# Patient Record
Sex: Female | Born: 1945 | Race: White | Hispanic: Yes | Marital: Married | Smoking: Never smoker
Health system: Southern US, Community
[De-identification: ages and names within clinical notes are randomized; demographics above are authoritative.]

## PROBLEM LIST (undated history)

## (undated) DIAGNOSIS — C50111 Malignant neoplasm of central portion of right female breast: Principal | ICD-10-CM

## (undated) DIAGNOSIS — H269 Unspecified cataract: Secondary | ICD-10-CM

## (undated) DIAGNOSIS — E119 Type 2 diabetes mellitus without complications: Secondary | ICD-10-CM

## (undated) DIAGNOSIS — C50919 Malignant neoplasm of unspecified site of unspecified female breast: Secondary | ICD-10-CM

## (undated) DIAGNOSIS — I10 Essential (primary) hypertension: Secondary | ICD-10-CM

## (undated) DIAGNOSIS — Z923 Personal history of irradiation: Secondary | ICD-10-CM

## (undated) DIAGNOSIS — H3552 Pigmentary retinal dystrophy: Secondary | ICD-10-CM

## (undated) DIAGNOSIS — R159 Full incontinence of feces: Secondary | ICD-10-CM

## (undated) DIAGNOSIS — E785 Hyperlipidemia, unspecified: Secondary | ICD-10-CM

## (undated) DIAGNOSIS — R32 Unspecified urinary incontinence: Secondary | ICD-10-CM

## (undated) HISTORY — DX: Malignant neoplasm of central portion of right female breast: C50.111

## (undated) HISTORY — DX: Malignant neoplasm of unspecified site of unspecified female breast: C50.919

---

## 2002-06-18 ENCOUNTER — Emergency Department (HOSPITAL_COMMUNITY): Admission: EM | Admit: 2002-06-18 | Discharge: 2002-06-18 | Payer: Self-pay | Admitting: Emergency Medicine

## 2002-06-18 ENCOUNTER — Encounter: Payer: Self-pay | Admitting: Emergency Medicine

## 2003-05-17 ENCOUNTER — Emergency Department (HOSPITAL_COMMUNITY): Admission: EM | Admit: 2003-05-17 | Discharge: 2003-05-18 | Payer: Self-pay | Admitting: *Deleted

## 2003-06-24 ENCOUNTER — Encounter: Admission: RE | Admit: 2003-06-24 | Discharge: 2003-06-24 | Payer: Self-pay | Admitting: Internal Medicine

## 2003-08-03 ENCOUNTER — Encounter: Admission: RE | Admit: 2003-08-03 | Discharge: 2003-08-03 | Payer: Self-pay | Admitting: Internal Medicine

## 2003-08-03 ENCOUNTER — Ambulatory Visit (HOSPITAL_COMMUNITY): Admission: RE | Admit: 2003-08-03 | Discharge: 2003-08-03 | Payer: Self-pay | Admitting: Internal Medicine

## 2003-09-01 ENCOUNTER — Encounter: Admission: RE | Admit: 2003-09-01 | Discharge: 2003-09-01 | Payer: Self-pay | Admitting: Internal Medicine

## 2004-03-16 ENCOUNTER — Ambulatory Visit: Payer: Self-pay | Admitting: Internal Medicine

## 2004-04-05 ENCOUNTER — Ambulatory Visit: Payer: Self-pay | Admitting: Internal Medicine

## 2004-04-22 ENCOUNTER — Encounter (INDEPENDENT_AMBULATORY_CARE_PROVIDER_SITE_OTHER): Payer: Self-pay | Admitting: Internal Medicine

## 2004-04-24 ENCOUNTER — Ambulatory Visit: Payer: Self-pay | Admitting: Internal Medicine

## 2004-04-24 ENCOUNTER — Encounter (INDEPENDENT_AMBULATORY_CARE_PROVIDER_SITE_OTHER): Payer: Self-pay | Admitting: Internal Medicine

## 2004-04-24 LAB — CONVERTED CEMR LAB: Pap Smear: NORMAL

## 2004-04-25 ENCOUNTER — Ambulatory Visit (HOSPITAL_COMMUNITY): Admission: RE | Admit: 2004-04-25 | Discharge: 2004-04-25 | Payer: Self-pay | Admitting: Family Medicine

## 2004-06-25 ENCOUNTER — Ambulatory Visit: Payer: Self-pay | Admitting: Internal Medicine

## 2004-09-26 ENCOUNTER — Ambulatory Visit: Payer: Self-pay | Admitting: Internal Medicine

## 2004-10-03 ENCOUNTER — Ambulatory Visit: Payer: Self-pay

## 2004-10-26 ENCOUNTER — Ambulatory Visit: Payer: Self-pay | Admitting: Internal Medicine

## 2004-11-28 ENCOUNTER — Ambulatory Visit: Payer: Self-pay | Admitting: Internal Medicine

## 2005-01-15 ENCOUNTER — Ambulatory Visit: Payer: Self-pay | Admitting: Internal Medicine

## 2005-01-25 ENCOUNTER — Ambulatory Visit (HOSPITAL_COMMUNITY): Admission: RE | Admit: 2005-01-25 | Discharge: 2005-01-25 | Payer: Self-pay | Admitting: Internal Medicine

## 2005-02-22 ENCOUNTER — Ambulatory Visit: Payer: Self-pay | Admitting: Internal Medicine

## 2005-04-19 ENCOUNTER — Ambulatory Visit (HOSPITAL_COMMUNITY): Admission: RE | Admit: 2005-04-19 | Discharge: 2005-04-19 | Payer: Self-pay | Admitting: Gastroenterology

## 2005-06-17 ENCOUNTER — Ambulatory Visit: Payer: Self-pay | Admitting: Internal Medicine

## 2005-10-30 ENCOUNTER — Encounter (INDEPENDENT_AMBULATORY_CARE_PROVIDER_SITE_OTHER): Payer: Self-pay | Admitting: Internal Medicine

## 2005-10-30 DIAGNOSIS — K7689 Other specified diseases of liver: Secondary | ICD-10-CM

## 2005-10-30 DIAGNOSIS — E118 Type 2 diabetes mellitus with unspecified complications: Secondary | ICD-10-CM | POA: Insufficient documentation

## 2005-10-30 DIAGNOSIS — R519 Headache, unspecified: Secondary | ICD-10-CM | POA: Insufficient documentation

## 2005-10-30 DIAGNOSIS — E119 Type 2 diabetes mellitus without complications: Secondary | ICD-10-CM | POA: Insufficient documentation

## 2005-10-30 DIAGNOSIS — R945 Abnormal results of liver function studies: Secondary | ICD-10-CM | POA: Insufficient documentation

## 2005-10-30 DIAGNOSIS — J309 Allergic rhinitis, unspecified: Secondary | ICD-10-CM | POA: Insufficient documentation

## 2005-10-30 DIAGNOSIS — R51 Headache: Secondary | ICD-10-CM

## 2005-10-30 DIAGNOSIS — E785 Hyperlipidemia, unspecified: Secondary | ICD-10-CM | POA: Insufficient documentation

## 2005-10-30 DIAGNOSIS — H3552 Pigmentary retinal dystrophy: Secondary | ICD-10-CM | POA: Insufficient documentation

## 2005-10-30 DIAGNOSIS — H543 Unqualified visual loss, both eyes: Secondary | ICD-10-CM | POA: Insufficient documentation

## 2005-10-30 DIAGNOSIS — G609 Hereditary and idiopathic neuropathy, unspecified: Secondary | ICD-10-CM | POA: Insufficient documentation

## 2005-10-30 DIAGNOSIS — D259 Leiomyoma of uterus, unspecified: Secondary | ICD-10-CM

## 2005-10-30 DIAGNOSIS — H269 Unspecified cataract: Secondary | ICD-10-CM | POA: Insufficient documentation

## 2005-10-30 DIAGNOSIS — M7500 Adhesive capsulitis of unspecified shoulder: Secondary | ICD-10-CM

## 2005-10-30 DIAGNOSIS — K219 Gastro-esophageal reflux disease without esophagitis: Secondary | ICD-10-CM | POA: Insufficient documentation

## 2005-10-30 DIAGNOSIS — I10 Essential (primary) hypertension: Secondary | ICD-10-CM | POA: Insufficient documentation

## 2005-10-30 DIAGNOSIS — L80 Vitiligo: Secondary | ICD-10-CM

## 2006-07-04 ENCOUNTER — Telehealth (INDEPENDENT_AMBULATORY_CARE_PROVIDER_SITE_OTHER): Payer: Self-pay | Admitting: *Deleted

## 2008-05-11 ENCOUNTER — Encounter: Admission: RE | Admit: 2008-05-11 | Discharge: 2008-05-11 | Payer: Self-pay | Admitting: Specialist

## 2010-01-27 ENCOUNTER — Encounter: Payer: Self-pay | Admitting: Internal Medicine

## 2012-02-07 ENCOUNTER — Encounter (INDEPENDENT_AMBULATORY_CARE_PROVIDER_SITE_OTHER): Payer: Self-pay | Admitting: Ophthalmology

## 2012-02-07 DIAGNOSIS — I10 Essential (primary) hypertension: Secondary | ICD-10-CM

## 2012-02-07 DIAGNOSIS — H35459 Secondary pigmentary degeneration, unspecified eye: Secondary | ICD-10-CM

## 2012-02-07 DIAGNOSIS — H35039 Hypertensive retinopathy, unspecified eye: Secondary | ICD-10-CM

## 2012-02-07 DIAGNOSIS — H4320 Crystalline deposits in vitreous body, unspecified eye: Secondary | ICD-10-CM

## 2012-02-07 DIAGNOSIS — E11319 Type 2 diabetes mellitus with unspecified diabetic retinopathy without macular edema: Secondary | ICD-10-CM

## 2012-02-07 DIAGNOSIS — E1165 Type 2 diabetes mellitus with hyperglycemia: Secondary | ICD-10-CM

## 2015-01-08 DIAGNOSIS — Z923 Personal history of irradiation: Secondary | ICD-10-CM

## 2015-01-08 HISTORY — DX: Personal history of irradiation: Z92.3

## 2015-01-11 ENCOUNTER — Telehealth (HOSPITAL_COMMUNITY): Payer: Self-pay | Admitting: *Deleted

## 2015-01-11 NOTE — Telephone Encounter (Signed)
Telephoned patient at home # and confirmed appointment with patient's husband. Used interpreter Lavon Paganini.

## 2015-01-12 ENCOUNTER — Encounter (HOSPITAL_COMMUNITY): Payer: Self-pay

## 2015-01-12 ENCOUNTER — Ambulatory Visit (HOSPITAL_COMMUNITY)
Admission: RE | Admit: 2015-01-12 | Discharge: 2015-01-12 | Disposition: A | Discharge status: Home / Self Care | Payer: Self-pay | Source: Ambulatory Visit | Attending: Obstetrics and Gynecology | Admitting: Obstetrics and Gynecology

## 2015-01-12 VITALS — BP 122/74 | Temp 97.8°F | Wt 135.0 lb

## 2015-01-12 DIAGNOSIS — N644 Mastodynia: Secondary | ICD-10-CM

## 2015-01-12 DIAGNOSIS — Z1239 Encounter for other screening for malignant neoplasm of breast: Secondary | ICD-10-CM

## 2015-01-12 HISTORY — DX: Hyperlipidemia, unspecified: E78.5

## 2015-01-12 HISTORY — DX: Essential (primary) hypertension: I10

## 2015-01-12 HISTORY — DX: Type 2 diabetes mellitus without complications: E11.9

## 2015-01-12 NOTE — Patient Instructions (Signed)
Educational materials on self breast awareness given. Explained to [facility] that she did not need a Pap smear today due to last Pap smear was in November 2015 per patient. Let her know to discuss with her PCP if she needs further Pap smears due to being over 70 years old. Referred patient to [facility] for right breast biopsy per recommendation. Appointment scheduled for Monday, January 16, 2015 at 0900. Patient aware of appointment and will be there. Rosa Padilla-Luna verbalized understanding.  Elija Mccamish, Arvil Chaco, RN 3:45 PM

## 2015-01-12 NOTE — Progress Notes (Signed)
Complaints of bilateral outer breast tenderness when touches.  Pap Smear: Pap smear not completed today. Last Pap smear was in November 2015 at the free cervical cancer screening at the [facility] that was sponsored by the [facility] and normal. Per patient has no history of an abnormal Pap smear. No Pap smear results in EPIC.  Physical exam: Breasts Breasts symmetrical. No skin abnormalities bilateral breasts. No nipple retraction bilateral breasts. No nipple discharge bilateral breasts. No lymphadenopathy. No lumps palpated bilateral breasts. Complaints of bilateral outer breast and right center breast tenderness on exam. Referred patient to [facility] for right breast biopsy per recommendation. Appointment scheduled for Monday, January 16, 2015 at 0900.     Pelvic/Bimanual No Pap smear completed today since last Pap smear was in November 2015 per patient. Pap smear not indicated per BCCCP guidelines.   Smoking History: Patient has never smoked.  Patient Navigation: Patient education provided. Access to services provided for patient through [facility] program. Spanish interpreter provided.  Colorectal Cancer Screening: Colonoscopy completed 6-7 years ago.  Used Spanish Interpreter [employer]

## 2015-01-16 ENCOUNTER — Other Ambulatory Visit: Payer: Self-pay | Admitting: Radiology

## 2015-01-20 ENCOUNTER — Telehealth: Payer: Self-pay | Admitting: *Deleted

## 2015-01-20 ENCOUNTER — Encounter (HOSPITAL_COMMUNITY): Payer: Self-pay | Admitting: *Deleted

## 2015-01-20 ENCOUNTER — Encounter: Payer: Self-pay | Admitting: *Deleted

## 2015-01-20 DIAGNOSIS — C50111 Malignant neoplasm of central portion of right female breast: Secondary | ICD-10-CM

## 2015-01-20 DIAGNOSIS — D0511 Intraductal carcinoma in situ of right breast: Secondary | ICD-10-CM | POA: Insufficient documentation

## 2015-01-20 HISTORY — DX: Malignant neoplasm of central portion of right female breast: C50.111

## 2015-01-20 NOTE — Telephone Encounter (Signed)
Confirmed [facility] for 01/25/15 at 0830 .  Instructions and contact information given.

## 2015-01-25 ENCOUNTER — Encounter: Payer: Self-pay | Admitting: Hematology

## 2015-01-25 ENCOUNTER — Other Ambulatory Visit: Payer: Self-pay | Admitting: General Surgery

## 2015-01-25 ENCOUNTER — Encounter: Payer: Self-pay | Admitting: Skilled Nursing Facility1

## 2015-01-25 ENCOUNTER — Ambulatory Visit: Payer: Self-pay | Admitting: Physical Therapy

## 2015-01-25 ENCOUNTER — Encounter: Payer: Self-pay | Admitting: Nurse Practitioner

## 2015-01-25 ENCOUNTER — Other Ambulatory Visit (HOSPITAL_BASED_OUTPATIENT_CLINIC_OR_DEPARTMENT_OTHER): Payer: Self-pay

## 2015-01-25 ENCOUNTER — Encounter: Payer: Self-pay | Admitting: *Deleted

## 2015-01-25 ENCOUNTER — Ambulatory Visit (HOSPITAL_BASED_OUTPATIENT_CLINIC_OR_DEPARTMENT_OTHER): Payer: Self-pay | Admitting: Hematology

## 2015-01-25 ENCOUNTER — Ambulatory Visit
Admission: RE | Admit: 2015-01-25 | Discharge: 2015-01-25 | Disposition: A | Discharge status: Home / Self Care | Payer: Self-pay | Source: Ambulatory Visit | Attending: Radiation Oncology | Admitting: Radiation Oncology

## 2015-01-25 VITALS — BP 145/71 | HR 79 | Temp 97.5°F | Resp 18 | Ht 62.0 in | Wt 134.4 lb

## 2015-01-25 DIAGNOSIS — D0511 Intraductal carcinoma in situ of right breast: Secondary | ICD-10-CM

## 2015-01-25 DIAGNOSIS — C50111 Malignant neoplasm of central portion of right female breast: Secondary | ICD-10-CM

## 2015-01-25 DIAGNOSIS — E119 Type 2 diabetes mellitus without complications: Secondary | ICD-10-CM

## 2015-01-25 DIAGNOSIS — I10 Essential (primary) hypertension: Secondary | ICD-10-CM

## 2015-01-25 DIAGNOSIS — Z170 Estrogen receptor positive status [ER+]: Secondary | ICD-10-CM

## 2015-01-25 DIAGNOSIS — Z803 Family history of malignant neoplasm of breast: Secondary | ICD-10-CM

## 2015-01-25 DIAGNOSIS — Z800 Family history of malignant neoplasm of digestive organs: Secondary | ICD-10-CM

## 2015-01-25 LAB — CBC WITH DIFFERENTIAL/PLATELET
BASO%: 0.4 % (ref 0.0–2.0)
BASOS ABS: 0 10*3/uL (ref 0.0–0.1)
EOS ABS: 0.1 10*3/uL (ref 0.0–0.5)
EOS%: 1.4 % (ref 0.0–7.0)
HEMATOCRIT: 36.6 % (ref 34.8–46.6)
HEMOGLOBIN: 12.4 g/dL (ref 11.6–15.9)
LYMPH#: 1.4 10*3/uL (ref 0.9–3.3)
LYMPH%: 27.5 % (ref 14.0–49.7)
MCH: 30.6 pg (ref 25.1–34.0)
MCHC: 34 g/dL (ref 31.5–36.0)
MCV: 90 fL (ref 79.5–101.0)
MONO#: 0.5 10*3/uL (ref 0.1–0.9)
MONO%: 9.5 % (ref 0.0–14.0)
NEUT#: 3.2 10*3/uL (ref 1.5–6.5)
NEUT%: 61.2 % (ref 38.4–76.8)
Platelets: 182 10*3/uL (ref 145–400)
RBC: 4.06 10*6/uL (ref 3.70–5.45)
RDW: 13 % (ref 11.2–14.5)
WBC: 5.2 10*3/uL (ref 3.9–10.3)

## 2015-01-25 LAB — COMPREHENSIVE METABOLIC PANEL
ALBUMIN: 4.1 g/dL (ref 3.5–5.0)
ALT: 35 U/L (ref 0–55)
AST: 29 U/L (ref 5–34)
Alkaline Phosphatase: 54 U/L (ref 40–150)
Anion Gap: 10 mEq/L (ref 3–11)
BUN: 8.4 mg/dL (ref 7.0–26.0)
CO2: 24 meq/L (ref 22–29)
Calcium: 9.3 mg/dL (ref 8.4–10.4)
Chloride: 102 mEq/L (ref 98–109)
Creatinine: 0.7 mg/dL (ref 0.6–1.1)
GLUCOSE: 140 mg/dL (ref 70–140)
POTASSIUM: 3.8 meq/L (ref 3.5–5.1)
Sodium: 136 mEq/L (ref 136–145)
TOTAL PROTEIN: 7.1 g/dL (ref 6.4–8.3)
Total Bilirubin: 0.51 mg/dL (ref 0.20–1.20)

## 2015-01-25 NOTE — Progress Notes (Signed)
Clinical Social Work [facility] Psychosocial Distress Screening [facility]  Patient completed distress screening protocol and scored a 6 on the Psychosocial Distress Thermometer which indicates moderate distress. Clinical Social Worker met with interpreter, patient and patients family in [facility] to assess for distress and other psychosocial needs. Patient stated she was feeling overwhelmed but felt "better" after meeting with the treatment team and getting more information on her treatment plan. CSW and patient discussed common feeling and emotions when being diagnosed with cancer, and the importance of support during treatment. CSW informed patient of the support team and support services at [facility]. CSW provided contact information and encouraged patient to call with any questions or concerns.   [facility] DISTRESS SCREENING 01/25/2015  Screening Type Initial Screening  Distress experienced in past week (1-10) 6  Practical problem type Insurance  Physician notified of physical symptoms Yes  Referral to clinical psychology No  Referral to clinical social work Yes  Referral to dietition No  Referral to financial advocate No  Referral to support programs No  Referral to palliative care No   Abigail Elmore, MSW, LCSW, OSW-C Clinical Social Worker [facility] [phone]       

## 2015-01-25 NOTE — Progress Notes (Signed)
  Radiation Oncology         [phone]) [phone] ________________________________  Initial Outpatient Consultation - Date: 01/25/2015   Name: Gail Manning MRN: [id number]   DOB: 11-Oct-1945  REFERRING PHYSICIAN: Alyssa Grove, MD  DIAGNOSIS AND STAGE: Stage 0 DCIS  HISTORY OF PRESENT ILLNESS::Gail Manning is a 70 y.o. female who presented to the [place] program earlier this year. Mammograms were ordered and the right mammogram showed an area of calcifications measuring about 1.5 cm. These were in the lower outer quadrant of the right breast. Stereotactic biopsy showed DCIS with calcifications and necrosis. This was intermediate grade and was ER/PR positive. She had no physical symptoms. An interpreter was present during this encounter. Her husband is present with her as well. She has bruising since her biopsy.   PREVIOUS RADIATION THERAPY: No  Past medical, social and family history were reviewed in the electronic chart. Review of symptoms was reviewed in the electronic chart. Medications were reviewed in the electronic chart.   Gynecologic History  Age at first menstrual period? 14  Are you still having periods? No Approximate date of last period? Age 83  If you no longer have periods: Have you used hormone replacement? Yes  If YES, for how long? 4-5 months Obstetric History:  How many children have you carried to term? 3 Your age at first live birth? 89  Pregnant now or trying to get pregnant? No  Have you used birth control pills or hormone shots for contraception? Yes  If so, for how long (or approximate dates)? 3 months or years Health Maintenance:  Have you ever had a colonoscopy? Yes  Have you ever had a bone density? Yes  Date of your last PAP smear? 3 years  PHYSICAL EXAM:  Vitals with BMI 01/25/2015  Height 5\' 2"   Weight 134 lbs 6 oz  BMI 123456  Systolic [id number]  Diastolic 71  Pulse 79  Respirations 18  Alert. Appears younger than her stated age. She has bruising over  the entire right breast with a palpable hematoma. No palpable abnormalities over the left breast. No palpable cervical, supraclavicular, or axillary adenopathy.  IMPRESSION: Stage 0 DCIS  PLAN: I spoke to the patient today regarding her diagnosis and options for treatment. We discussed the equivalence in terms of survival and local failure between mastectomy and breast conservation. We discussed the role of radiation in decreasing local failures in patients who undergo lumpectomy. We discussed the process of CT simulation and the placement tattoos. We discussed 4 weeks of treatment as an outpatient. We discussed the possibility of asymptomatic lung damage. We discussed the low likelihood of secondary malignancies. We discussed the possible side effects including but not limited to skin redness, fatigue, permanent skin darkening, and breast swelling.  I printed a letter for her husband to excuse him from work for the next two months since he provides transportation for her.  I spent 40 minutes  face to face with the patient and more than 50% of that time was spent in counseling and/or coordination of care.   ------------------------------------------------  Thea Silversmith, MD  This document serves as a record of services personally performed by Thea Silversmith, MD. It was created on her behalf by Darcus Austin, a trained medical scribe. The creation of this record is based on the scribe's personal observations and the provider's statements to them. This document has been checked and approved by the attending provider.

## 2015-01-25 NOTE — Progress Notes (Signed)
Subjective:     Patient ID: Gail Manning, female   DOB: 1945/06/17, 70 y.o.   MRN: [id number]  HPI   Review of Systems     Objective:   Physical Exam For the patient to understand and be given the tools to implement a healthy plant based diet during their cancer diagnosis.     Assessment:     Patient was seen today and found to be in good spirits and accompanied by her husband. Pt was extremely knowledgeable of the nutrition information. Dietitian quized the pt and she was able to correctly answer them all. Pt was just educated at the [facility] one month ago.      Plan:     Dietitian will send what spanish materials are availble.

## 2015-01-25 NOTE — Progress Notes (Signed)
[place]  [phone] Fax:([phone]) [place] Note   Patient Care Team: Alyssa Grove, MD as PCP - General Sylvan Cheese, NP as Nurse Practitioner (Hematology and Oncology) 01/25/2015  CHIEF COMPLAINTS/PURPOSE OF CONSULTATION:  Right breast DCIS  Oncology History   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, right   Staging form: Breast, AJCC 7th Edition     Clinical stage from 01/16/2015: Stage 0 (Tis (DCIS), N0, M0) - Signed by Truitt Merle, MD on 01/25/2015       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 Spanish-speaking female is here because of her recently diagnosed right breast cancer. She is accompanied her husband and interpreter Gregary Signs to our multidisciplinary breast clinic today.  This was discovered by screening mammogram. She had no palpable breast mass, no skin or nipple change. She denies any pain, or other new symptoms. She has good appetite and energy level, no recent weight loss. She recently had some left hearing issue, will see a ENT. 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 She lives with her husband, does not exercise regularly, but is physically active.    GYN HISTORY  Menarchal: 17 LMP: 53 Contraceptive: No       HRT: 4-5 months  G6P3: (+) breast feeding    MEDICAL HISTORY:  Past Medical History  Diagnosis Date  . Hypertension   . Hyperlipidemia   . Diabetes mellitus without complication ([place])   . 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 Breast cancer [facility])     SURGICAL HISTORY: Past Surgical  History  Procedure Laterality Date  . Cesarean section      x3    SOCIAL HISTORY: Social History   Social History  . Marital Status: Married    Spouse Name: N/A  . Number of Children: N/A  . Years of Education: N/A   Occupational History  . Not on file.   Social History Main Topics  . Smoking status: Never Smoker   . Smokeless tobacco: Never Used  . Alcohol Use: No  . Drug Use: No  . Sexual Activity: Not on file   Other Topics Concern  . Not on file   Social History Narrative    FAMILY HISTORY: Family History  Problem Relation Age of Onset  . Diabetes Mother   . Hypertension Mother   . Rheum arthritis Mother   . Cancer Father     colon  . Diabetes Father   . Hypertension Father   . Breast cancer Sister   . Hypertension Sister     ALLERGIES:  is allergic to albuterol sulfate.  MEDICATIONS:  Current Outpatient Prescriptions  Medication Sig Dispense Refill  . calcium & magnesium carbonates (MYLANTA) 810-175 MG tablet Take 3 tablets by mouth daily.    Marland Kitchen lisinopril (PRINIVIL,ZESTRIL) 20 MG tablet Take 20 mg by mouth daily.    Marland Kitchen lovastatin (MEVACOR) 20 MG tablet Take 20 mg by mouth at bedtime.    . metFORMIN (GLUCOPHAGE) 1000 MG tablet Take 1,000 mg by mouth 2 (two) times daily with a meal. Reported on  01/25/2015    . naproxen sodium (ALEVE) 220 MG tablet Take 440 mg by mouth 2 (two) times daily with a meal.     No current facility-administered medications for this visit.    REVIEW OF SYSTEMS:   Constitutional: Denies fevers, chills or abnormal night sweats Eyes: Denies blurriness of vision, double vision or watery eyes Ears, nose, mouth, throat, and face: Denies mucositis or sore throat Respiratory: Denies cough, dyspnea or wheezes Cardiovascular: Denies palpitation, chest discomfort or lower extremity swelling Gastrointestinal:  Denies nausea, heartburn or change in bowel habits Skin: Denies abnormal skin rashes Lymphatics: Denies new lymphadenopathy or  easy bruising Neurological:Denies numbness, tingling or new weaknesses Behavioral/Psych: Mood is stable, no new changes  All other systems were reviewed with the patient and are negative.  PHYSICAL EXAMINATION: ECOG PERFORMANCE STATUS: 1 - Symptomatic but completely ambulatory  Filed Vitals:   01/25/15 0903  BP: 145/71  Pulse: 79  Temp: 97.5 F (36.4 C)  Resp: 18   Filed Weights   01/25/15 0903  Weight: 134 lb 6.4 oz (60.963 kg)    GENERAL:alert, no distress and comfortable SKIN: skin color, texture, turgor are normal, no rashes or significant lesions EYES: normal, conjunctiva are pink and non-injected, sclera clear OROPHARYNX:no exudate, no erythema and lips, buccal mucosa, and tongue normal  NECK: supple, thyroid normal size, non-tender, without nodularity LYMPH:  no palpable lymphadenopathy in the cervical, axillary or inguinal LUNGS: clear to auscultation and percussion with normal breathing effort HEART: regular rate & rhythm and no murmurs and no lower extremity edema ABDOMEN:abdomen soft, non-tender and normal bowel sounds Musculoskeletal:no cyanosis of digits and no clubbing  PSYCH: alert & oriented x 3 with fluent speech NEURO: no focal motor/sensory deficits Breasts: Breast inspection showed them to be symmetrical with no nipple discharge. (+) significant skin bruise and a large hematoma in the biopsy site of the right breast, tender. Palpation of the left breasts and axilla revealed no obvious mass that I could appreciate.   LABORATORY DATA:  I have reviewed the data as listed Lab Results  Component Value Date   WBC 5.2 01/25/2015   HGB 12.4 01/25/2015   HCT 36.6 01/25/2015   MCV 90.0 01/25/2015   PLT 182 01/25/2015    Recent Labs  01/25/15 0845  NA 136  K 3.8  CO2 24  GLUCOSE 140  BUN 8.4  CREATININE 0.7  CALCIUM 9.3  PROT 7.1  ALBUMIN 4.1  AST 29  ALT 35  ALKPHOS 54  BILITOT 0.51    PATHOLOGY REPORT  Diagnosis 01/16/2015 Breast, right,  needle core biopsy - DUCTAL CARCINOMA IN SITU WITH NECROSIS AND CALCIFICATIONS. - SEE MICROSCOPIC DESCRIPTION Results: IMMUNOHISTOCHEMICAL AND MORPHOMETRIC ANALYSIS PERFORMED MANUALLY Estrogen Receptor: 100%, POSITIVE, STRONG STAINING INTENSITY Progesterone Receptor: 30%, POSITIVE, STRONG STAINING INTENSITY   RADIOGRAPHIC STUDIES: I have personally reviewed the radiological images as listed and agreed with the findings in the report. No results found.  ASSESSMENT & PLAN:  70 year-old postmenopausal woman, presented with screening discovered DCIS.  1. Right breast DCIS, intermediate grade, ER/PR strongly positive -I discussed her breast imaging and needle biopsy results with patient and her husband in great detail. -She is a candidate for breast conservation surgery. She has been seen by breast surgeon Dr. Excell Seltzer, who recommends lumpectomy. -we discussed that her DCIS will be cured by complete surgical resection, but as she is at increased risk for future breast cancer.Any form of adjuvant therapy is to prevent future breast cancer. -Her DCIS will be  cured by complete surgical resection. Any form of adjuvant therapy is preventive. -Given her strongly positive ER and PR, I recommend antiestrogen therapy with tamoxifen or anastrozole, which decrease her risk of future breast cancer by ~40%. Benefit and potential side effects of these drugs were discussed with her and her family members. She is interested. -She will likely benefit from breast radiation if she undergo lumpectomy to decrease the risk of breast cancer. -We also discussed that biopsy may have sampling limitation, we will review her surgical path, to see if she has any invasive carcinoma components. -We discussed breast cancer surveillance after she completes treatment, Including annual mammogram, breast exam every 6-12 months.  2. HTN, DM -she will continue follow-up with her primary care physician  Plan -She will likely have  lumpectomy soon -I'll see her 2-3 weeks after her surgery to review her pathology result and finalize her antiestrogen therapy.      All questions were answered. The patient knows to call the clinic with any problems, questions or concerns. I spent 45 minutes counseling the patient face to face. The total time spent in the appointment was 55 minutes and more than 50% was on counseling.     Truitt Merle, MD 01/25/2015 6:16 PM

## 2015-01-25 NOTE — Progress Notes (Signed)
Ms. Gail Manning is a very pleasant 70 y.o. female from [place], [place] with newly diagnosed ductal carcinoma in situ of the right breast.  Biopsy results revealed the tumor's prognostic profile is ER positive and PR positive. '  She presents today with her husband to the [facility]) for treatment consideration and recommendations from the breast surgeon, radiation oncologist, and medical oncologist.     I briefly met with Ms. Gail Manning and her husband during her [facility] visit today. With the assistance of Benjamine Sprague (interpreter), we discussed the purpose of the Survivorship Clinic, which will include monitoring for recurrence, coordinating completion of age and gender-appropriate cancer screenings, promotion of overall wellness, as well as managing potential late/long-term side effects of anti-cancer treatments.    The treatment plan for [name] will likely include surgery, radiation therapy, and anti-estrogen therapy.  As of today, the intent of treatment for [name] is cure, therefore she will be eligible for the Survivorship Clinic upon her completion of treatment.  Her survivorship care plan (SCP) document will be drafted and updated throughout the course of her treatment trajectory. She will receive the SCP in an office visit with myself in the Survivorship Clinic once she has completed treatment.   Ms. Gail Manning was encouraged to ask questions and all questions were answered to her satisfaction.  She was given my business card and encouraged to contact me with any concerns regarding survivorship.  I look forward to participating in her care.   Kenn File, [place] [phone]

## 2015-02-08 DIAGNOSIS — C50919 Malignant neoplasm of unspecified site of unspecified female breast: Secondary | ICD-10-CM

## 2015-02-08 HISTORY — DX: Malignant neoplasm of unspecified site of unspecified female breast: C50.919

## 2015-02-08 HISTORY — PX: BREAST LUMPECTOMY: SHX2

## 2015-02-09 NOTE — H&P (Signed)
History of Present Illness Gail Kitchen T. Season Astacio MD; 01/25/2015 10:01 AM) The patient is a 70 year old female who presents with breast cancer. patient is a very pleasant 70 year old Spanish-speaking female referred by Dr. Gabriel Rainwater a for a new diagnosis of ductal carcinoma in situ of the right breast. The patient has had regular screening mammograms. She had a sister at age 48 treated for breast cancer. Recent screening mammogram revealed a new area of calcifications in the right breast. Diagnostic mammogram was performed revealing a new Area of pleomorphic calcifications in the right breast central to the nipple anterior depth in the lower outer quadrant. this measured at the greatest about 2 cm. Stereotactic biopsy was performed revealing intermediate grade DCIS, ER/PR positive. She has had no breast symptoms, specifically lump, pain, nipple discharge or skin changes.   Other Problems Gail Slipper, RN; 01/24/2015 4:28 PM) Anxiety Disorder Back Pain Bladder Problems Depression Diabetes Mellitus Gastroesophageal Reflux Disease General anesthesia - complications Hemorrhoids High blood pressure Lump In Breast Transfusion history  Past Surgical History Gail Slipper, RN; 01/24/2015 4:28 PM) Breast Biopsy Right. Cesarean Section - Multiple  Diagnostic Studies History Gail Slipper, RN; 01/24/2015 4:28 PM) Colonoscopy 1-5 years ago Mammogram within last year Pap Smear >5 years ago  Medication History Gail Slipper, RN; 01/24/2015 4:28 PM) No Current Medications Medications Reconciled  Social History Gail Slipper, RN; 01/24/2015 4:28 PM) Alcohol use Occasional alcohol use. Caffeine use Coffee, Tea. No drug use Tobacco use Never smoker.  Family History Gail Slipper, RN; 01/24/2015 4:28 PM) Alcohol Abuse Father, Sister. Arthritis Mother. Breast Cancer Sister. Colon Cancer Father. Diabetes Mellitus Brother, Father, Mother. Heart Disease  Daughter. Hypertension Brother, Mother. Kidney Disease Daughter. Seizure disorder Brother.  Pregnancy / Birth History Gail Slipper, RN; 01/24/2015 4:28 PM) Age at menarche 67 years. Age of menopause 36-50 Gravida 7 Irregular periods Maternal age 39-25 Para 3    Review of Systems Gail Slipper RN; 01/24/2015 4:28 PM) General Present- Chills. Not Present- Appetite Loss, Fatigue, Fever, Night Sweats, Weight Gain and Weight Loss. Skin Present- Dryness and Rash. Not Present- Change in Wart/Mole, Hives, Jaundice, New Lesions, Non-Healing Wounds and Ulcer. HEENT Present- Earache, Hearing Loss, Ringing in the Ears, Sinus Pain, Sore Throat, Visual Disturbances and Wears glasses/contact lenses. Not Present- Hoarseness, Nose Bleed, Oral Ulcers, Seasonal Allergies and Yellow Eyes. Respiratory Present- Chronic Cough and Snoring. Not Present- Bloody sputum, Difficulty Breathing and Wheezing. Breast Present- Breast Mass and Breast Pain. Not Present- Nipple Discharge and Skin Changes. Cardiovascular Present- Swelling of Extremities. Not Present- Chest Pain, Difficulty Breathing Lying Down, Leg Cramps, Palpitations, Rapid Heart Rate and Shortness of Breath. Gastrointestinal Present- Change in Bowel Habits, Constipation, Hemorrhoids and Rectal Pain. Not Present- Abdominal Pain, Bloating, Bloody Stool, Chronic diarrhea, Difficulty Swallowing, Excessive gas, Gets full quickly at meals, Indigestion, Nausea and Vomiting. Female Genitourinary Present- Frequency, Painful Urination and Urgency. Not Present- Nocturia and Pelvic Pain. Musculoskeletal Present- Back Pain and Joint Pain. Not Present- Joint Stiffness, Muscle Pain, Muscle Weakness and Swelling of Extremities. Neurological Present- Fainting, Headaches, Numbness, Tremor and Trouble walking. Not Present- Decreased Memory, Seizures, Tingling and Weakness. Psychiatric Present- Change in Sleep Pattern and Depression. Not Present- Anxiety, Bipolar, Fearful  and Frequent crying. Endocrine Present- Cold Intolerance, Heat Intolerance and Hot flashes. Not Present- Excessive Hunger, Hair Changes and New Diabetes. Hematology Present- Excessive bleeding and Persistent Infections. Not Present- Easy Bruising, Gland problems and HIV.   Physical Exam Gail Kitchen T. Abrahan Fulmore MD; 01/25/2015 10:04 AM) The physical exam findings are as  follows: Note:General: Alert, elderly Hispanic female, in no distress Skin: Warm and dry without rash or infection. HEENT: No palpable masses or thyromegaly. Sclera nonicteric. Pupils equal round and reactive. Breasts: There is a moderate hematoma in the latere abnormalitiet which is tender. No other palpable abnormalities. No ng.ple inversion or skin changes or nipple crusting. Lymph nodes: No cervical, supraclavicular, nodes palpable. Lungs: Breath sounds clear and equal. No wheezing or increased work of breathing. Cardiovascular: Regular rate and rhythm without murmer. No JVD or edema. Abdomen: Nondistended. Soft and nontender. No masses palpable. No organomegaly. No palpable hernias. Extremities: No edema or joint swelling or deformity. No chronic venous stasis changes. Neurologic: Alert and fully oriented. gait is slow, walks with a cane Psychiatric: Normal mood and affect. Thought content appropriate with normal judgement and insight    Assessment & Plan Gail Kitchen T. Jamese Trauger MD; 01/25/2015 10:12 AM) DCIS (DUCTAL CARCINOMA IN SITU), RIGHT (D05.11) Impression: new diagnosis of ER/PR positive intermediate grade ductal carcinoma in situ of the right breast estimated size approximately 2 cm. I discussed with the patient and her husband via interpreter surgical options. I believe she would be a good candidate for lumpectomy and this is what she would prefer. I discussed use of radioactive seed localization and the nature of surgery. We discussed that she would likely need radiation therapy. We discussed possible complications  including bleeding, infection, anesthetic risks and possible need for further surgery based on final margins. All he questions were answered Current Plans Pt Education - [facility] Breast Cancer Information Given - [employer] "Breast Journey" Package Radioactive seed localized right breast lumpectomy

## 2015-02-10 ENCOUNTER — Ambulatory Visit (HOSPITAL_BASED_OUTPATIENT_CLINIC_OR_DEPARTMENT_OTHER)
Admission: RE | Admit: 2015-02-10 | Discharge: 2015-02-10 | Disposition: A | Discharge status: Home / Self Care | Payer: Self-pay | Source: Ambulatory Visit | Attending: General Surgery | Admitting: General Surgery

## 2015-02-10 ENCOUNTER — Encounter (HOSPITAL_BASED_OUTPATIENT_CLINIC_OR_DEPARTMENT_OTHER): Payer: Self-pay | Admitting: *Deleted

## 2015-02-10 ENCOUNTER — Ambulatory Visit (HOSPITAL_BASED_OUTPATIENT_CLINIC_OR_DEPARTMENT_OTHER): Payer: Self-pay | Admitting: Anesthesiology

## 2015-02-10 ENCOUNTER — Encounter (HOSPITAL_BASED_OUTPATIENT_CLINIC_OR_DEPARTMENT_OTHER)
Admission: RE | Disposition: A | Discharge status: Home / Self Care | Payer: Self-pay | Source: Ambulatory Visit | Attending: General Surgery

## 2015-02-10 ENCOUNTER — Telehealth: Payer: Self-pay | Admitting: *Deleted

## 2015-02-10 DIAGNOSIS — Z79899 Other long term (current) drug therapy: Secondary | ICD-10-CM | POA: Insufficient documentation

## 2015-02-10 DIAGNOSIS — K219 Gastro-esophageal reflux disease without esophagitis: Secondary | ICD-10-CM | POA: Insufficient documentation

## 2015-02-10 DIAGNOSIS — Z7984 Long term (current) use of oral hypoglycemic drugs: Secondary | ICD-10-CM | POA: Insufficient documentation

## 2015-02-10 DIAGNOSIS — M1990 Unspecified osteoarthritis, unspecified site: Secondary | ICD-10-CM | POA: Insufficient documentation

## 2015-02-10 DIAGNOSIS — I10 Essential (primary) hypertension: Secondary | ICD-10-CM | POA: Insufficient documentation

## 2015-02-10 DIAGNOSIS — N6091 Unspecified benign mammary dysplasia of right breast: Secondary | ICD-10-CM | POA: Insufficient documentation

## 2015-02-10 DIAGNOSIS — N6021 Fibroadenosis of right breast: Secondary | ICD-10-CM | POA: Insufficient documentation

## 2015-02-10 DIAGNOSIS — Z791 Long term (current) use of non-steroidal anti-inflammatories (NSAID): Secondary | ICD-10-CM | POA: Insufficient documentation

## 2015-02-10 DIAGNOSIS — D0511 Intraductal carcinoma in situ of right breast: Secondary | ICD-10-CM | POA: Insufficient documentation

## 2015-02-10 DIAGNOSIS — Z170 Estrogen receptor positive status [ER+]: Secondary | ICD-10-CM | POA: Insufficient documentation

## 2015-02-10 DIAGNOSIS — E119 Type 2 diabetes mellitus without complications: Secondary | ICD-10-CM | POA: Insufficient documentation

## 2015-02-10 HISTORY — PX: BREAST LUMPECTOMY WITH NEEDLE LOCALIZATION: SHX5759

## 2015-02-10 LAB — GLUCOSE, CAPILLARY
GLUCOSE-CAPILLARY: 104 mg/dL — AB (ref 65–99)
Glucose-Capillary: 95 mg/dL (ref 65–99)

## 2015-02-10 SURGERY — BREAST LUMPECTOMY WITH NEEDLE LOCALIZATION
Anesthesia: General | Site: Breast | Laterality: Right

## 2015-02-10 MED ORDER — FENTANYL CITRATE (PF) 100 MCG/2ML IJ SOLN
INTRAMUSCULAR | Status: AC
  Filled 2015-02-10: qty 2

## 2015-02-10 MED ORDER — MIDAZOLAM HCL 2 MG/2ML IJ SOLN
INTRAMUSCULAR | Status: AC
  Filled 2015-02-10: qty 2

## 2015-02-10 MED ORDER — HYDROMORPHONE HCL 1 MG/ML IJ SOLN
0.2500 mg | INTRAMUSCULAR | Status: DC | PRN
  Administered 2015-02-10: 0.5 mg via INTRAVENOUS
  Administered 2015-02-10: 0.25 mg via INTRAVENOUS
  Administered 2015-02-10: 0.5 mg via INTRAVENOUS

## 2015-02-10 MED ORDER — OXYCODONE HCL 5 MG PO TABS: 5.0000 mg | ORAL_TABLET | Freq: Once | ORAL | Status: DC | PRN

## 2015-02-10 MED ORDER — MIDAZOLAM HCL 5 MG/5ML IJ SOLN
INTRAMUSCULAR | Status: DC | PRN
  Administered 2015-02-10: 2 mg via INTRAVENOUS

## 2015-02-10 MED ORDER — PROPOFOL 10 MG/ML IV BOLUS
INTRAVENOUS | Status: DC | PRN
  Administered 2015-02-10: 150 mg via INTRAVENOUS

## 2015-02-10 MED ORDER — CHLORHEXIDINE GLUCONATE 4 % EX LIQD: 1.0000 "application " | Freq: Once | CUTANEOUS | Status: DC

## 2015-02-10 MED ORDER — BUPIVACAINE-EPINEPHRINE 0.5% -1:200000 IJ SOLN
INTRAMUSCULAR | Status: DC | PRN
  Administered 2015-02-10: 20 mL

## 2015-02-10 MED ORDER — ONDANSETRON HCL 4 MG/2ML IJ SOLN: 4.0000 mg | Freq: Four times a day (QID) | INTRAMUSCULAR | Status: DC | PRN

## 2015-02-10 MED ORDER — OXYCODONE HCL 5 MG/5ML PO SOLN: 5.0000 mg | Freq: Once | ORAL | Status: DC | PRN

## 2015-02-10 MED ORDER — LACTATED RINGERS IV SOLN
INTRAVENOUS | Status: DC
  Administered 2015-02-10: 10 mL/h via INTRAVENOUS

## 2015-02-10 MED ORDER — CEFAZOLIN SODIUM-DEXTROSE 2-3 GM-% IV SOLR
INTRAVENOUS | Status: AC
  Filled 2015-02-10: qty 50

## 2015-02-10 MED ORDER — ONDANSETRON HCL 4 MG/2ML IJ SOLN
INTRAMUSCULAR | Status: DC | PRN
  Administered 2015-02-10: 4 mg via INTRAVENOUS

## 2015-02-10 MED ORDER — CEFAZOLIN SODIUM-DEXTROSE 2-3 GM-% IV SOLR
2.0000 g | INTRAVENOUS | Status: AC
  Administered 2015-02-10: 2 g via INTRAVENOUS

## 2015-02-10 MED ORDER — HYDROCODONE-ACETAMINOPHEN 5-325 MG PO TABS: 1.0000 | ORAL_TABLET | ORAL | Status: DC | PRN

## 2015-02-10 MED ORDER — HYDROMORPHONE HCL 1 MG/ML IJ SOLN
INTRAMUSCULAR | Status: AC
  Filled 2015-02-10: qty 1

## 2015-02-10 MED ORDER — PHENYLEPHRINE HCL 10 MG/ML IJ SOLN
INTRAMUSCULAR | Status: DC | PRN
  Administered 2015-02-10 (×4): 40 ug via INTRAVENOUS

## 2015-02-10 MED ORDER — FENTANYL CITRATE (PF) 100 MCG/2ML IJ SOLN
INTRAMUSCULAR | Status: DC | PRN
  Administered 2015-02-10: 100 ug via INTRAVENOUS

## 2015-02-10 MED ORDER — LIDOCAINE HCL (CARDIAC) 20 MG/ML IV SOLN
INTRAVENOUS | Status: DC | PRN
  Administered 2015-02-10: 60 mg via INTRAVENOUS

## 2015-02-10 MED ORDER — ONDANSETRON HCL 4 MG/2ML IJ SOLN
INTRAMUSCULAR | Status: AC
  Filled 2015-02-10: qty 2

## 2015-02-10 MED ORDER — PHENYLEPHRINE 40 MCG/ML (10ML) SYRINGE FOR IV PUSH (FOR BLOOD PRESSURE SUPPORT)
PREFILLED_SYRINGE | INTRAVENOUS | Status: AC
  Filled 2015-02-10: qty 10

## 2015-02-10 MED ORDER — BUPIVACAINE-EPINEPHRINE (PF) 0.5% -1:200000 IJ SOLN
INTRAMUSCULAR | Status: AC
  Filled 2015-02-10: qty 30

## 2015-02-10 SURGICAL SUPPLY — 47 items
APPLIER CLIP 9.375 MED OPEN (MISCELLANEOUS) ×3
APR CLP MED 9.3 20 MLT OPN (MISCELLANEOUS) ×1
BINDER BREAST LRG (GAUZE/BANDAGES/DRESSINGS) ×2 IMPLANT
BLADE SURG 15 STRL LF DISP TIS (BLADE) ×1 IMPLANT
BLADE SURG 15 STRL SS (BLADE) ×3
CANISTER SUCT 1200ML W/VALVE (MISCELLANEOUS) IMPLANT
CHLORAPREP W/TINT 26ML (MISCELLANEOUS) ×3 IMPLANT
CLIP APPLIE 9.375 MED OPEN (MISCELLANEOUS) IMPLANT
CLIP TI WIDE RED SMALL 6 (CLIP) IMPLANT
COVER BACK TABLE 60X90IN (DRAPES) ×3 IMPLANT
COVER MAYO STAND STRL (DRAPES) ×3 IMPLANT
DEVICE DUBIN W/COMP PLATE 8390 (MISCELLANEOUS) ×3 IMPLANT
DRAPE LAPAROTOMY 100X72 PEDS (DRAPES) ×3 IMPLANT
DRAPE UTILITY XL STRL (DRAPES) ×3 IMPLANT
ELECT COATED BLADE 2.86 ST (ELECTRODE) ×3 IMPLANT
ELECT REM PT RETURN 9FT ADLT (ELECTROSURGICAL) ×3
ELECTRODE REM PT RTRN 9FT ADLT (ELECTROSURGICAL) ×1 IMPLANT
GLOVE BIO SURGEON STRL SZ7 (GLOVE) ×2 IMPLANT
GLOVE BIOGEL PI IND STRL 7.0 (GLOVE) IMPLANT
GLOVE BIOGEL PI IND STRL 8 (GLOVE) ×1 IMPLANT
GLOVE BIOGEL PI INDICATOR 7.0 (GLOVE) ×4
GLOVE BIOGEL PI INDICATOR 8 (GLOVE) ×2
GLOVE ECLIPSE 6.5 STRL STRAW (GLOVE) ×2 IMPLANT
GLOVE ECLIPSE 7.5 STRL STRAW (GLOVE) ×5 IMPLANT
GLOVE EXAM NITRILE EXT CUFF MD (GLOVE) ×2 IMPLANT
GOWN STRL REUS W/ TWL LRG LVL3 (GOWN DISPOSABLE) ×1 IMPLANT
GOWN STRL REUS W/ TWL XL LVL3 (GOWN DISPOSABLE) ×1 IMPLANT
GOWN STRL REUS W/TWL LRG LVL3 (GOWN DISPOSABLE) ×6
GOWN STRL REUS W/TWL XL LVL3 (GOWN DISPOSABLE) ×3
ILLUMINATOR WAVEGUIDE N/F (MISCELLANEOUS) IMPLANT
KIT MARKER MARGIN INK (KITS) ×3 IMPLANT
LIQUID BAND (GAUZE/BANDAGES/DRESSINGS) ×3 IMPLANT
NDL HYPO 25X1 1.5 SAFETY (NEEDLE) ×1 IMPLANT
NEEDLE HYPO 25X1 1.5 SAFETY (NEEDLE) ×3 IMPLANT
NS IRRIG 1000ML POUR BTL (IV SOLUTION) ×3 IMPLANT
PACK BASIN DAY SURGERY FS (CUSTOM PROCEDURE TRAY) ×3 IMPLANT
PENCIL BUTTON HOLSTER BLD 10FT (ELECTRODE) ×3 IMPLANT
SLEEVE SCD COMPRESS KNEE MED (MISCELLANEOUS) ×3 IMPLANT
SUT MON AB 5-0 PS2 18 (SUTURE) ×3 IMPLANT
SUT VICRYL 3-0 CR8 SH (SUTURE) ×3 IMPLANT
SYR BULB 3OZ (MISCELLANEOUS) IMPLANT
SYR CONTROL 10ML LL (SYRINGE) ×3 IMPLANT
TOWEL OR 17X24 6PK STRL BLUE (TOWEL DISPOSABLE) ×3 IMPLANT
TOWEL OR NON WOVEN STRL DISP B (DISPOSABLE) ×1 IMPLANT
TUBE CONNECTING 20'X1/4 (TUBING)
TUBE CONNECTING 20X1/4 (TUBING) IMPLANT
YANKAUER SUCT BULB TIP NO VENT (SUCTIONS) IMPLANT

## 2015-02-10 NOTE — Op Note (Signed)
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 The calcifications and about 2 cm. After previous workup and discussion regarding the procedure and indications and risks detailed elsewhere we have elected to proceed with lumpectomy as surgical treatment. The patient originally was scheduled for radioactive seed localization but was noted to have a hematoma at the biopsy site and wire localization was recommended. This was performed this morning and I discussed this with Dr. Marcelo Baldy. He states that the residual calcifications were somewhat medial to the biopsy clip likely displaced by the hematoma and that the wire was inserted with its tip just at the medial edge of the residual abnormal calcifications which lay essentially between the wire and the marking clip which was 1.8 cm lateral to the wire.    Procedure Detail:  Patient was taken to the operating room, placed in the supine position on the operating table, and laryngeal mask general anesthesia induced. The right breast was widely sterilely prepped and draped. Patient timeout was performed and correct procedure verified. She received preoperative IV antibiotics. PAS were in place. I made a curvilinear incision at the areolar border laterally at the wire insertion site and dissection was carried down into the subcutaneous tissue. The calcifications were fairly superficial about 1.5 cm deep from the skin and the areola extending over to the nipple and just beyond medially was elevated off the breast tissue and a small skin subcutaneous flap also raised laterally.  There was a firm area at and just lateral to the wire insertion site from previous biopsy and hematoma. Using cautery a generous specimen of breast tissue was excised around the shaft and tip of the wire taking the excision more laterally as noted above. All of the firm area was excised back to normal appearing tissue and this was taken down about 3-4 cm deep and the specimen excised. Specimen x-ray was obtained showing the intact wire and the remaining abnormal calcifications within the specimen but the dumbbell clip was not present. I then excised further inferior and lateral margins to see if we could obtain the clip and these were oriented with ink. As excised the lateral margin there was a small residual area of hematoma noted and I shaved this off and sent it as a permanent specimen. X-ray of these specimens did not show the marking clip or any further abnormal calcifications. At this point I was really out toward the subcutaneous tissue laterally. I suspect the marking clip which had been displaced by the hematoma might have been further displaced or removed during the dissection. I did not feel that any further excision of breast tissue in this situation was warranted. The wound was thoroughly irrigated and hemostasis obtained. I mobilized breast tissue off the chest wall inferiorly laterally and medially to allow closure of the defect. The lumpectomy cavity was marked with clips. The breast and subcutaneous tissue was closed with interrupted 3-0 Vicryl. Skin was closed with subcutaneous testicular 4-0 Monocryl and Dermabond. Sponge needle and instrument counts were correct.    Findings: As above  Estimated Blood Loss:  Minimal         Drains: nnone  Blood Given: none  Specimens: #1 right breast lumpectomy   #2 further inferior margin oriented  #3 further lateral margin oriented    #4 shave biopsy of new lateral margin        Complications:  * No complications entered in OR log *          Disposition: PACU - hemodynamically stable.         Condition: stable

## 2015-02-10 NOTE — Telephone Encounter (Signed)
Late entry - 1/23 Via spanish interpreter pt relate she is doing well. Denies questions regarding dx or treatment  Request pt call with any questions or needs.

## 2015-02-10 NOTE — Anesthesia Preprocedure Evaluation (Signed)
Anesthesia Evaluation  Patient identified by MRN, date of birth, ID band Patient awake    Reviewed: Allergy & Precautions, H&P , NPO status , Patient's Chart, lab work & pertinent test results  Airway Mallampati: II   Neck ROM: full    Dental   Pulmonary neg pulmonary ROS,    breath sounds clear to auscultation       Cardiovascular hypertension,  Rhythm:regular Rate:Normal     Neuro/Psych  Headaches,  Neuromuscular disease    GI/Hepatic GERD  ,  Endo/Other  diabetes, Type 2  Renal/GU      Musculoskeletal  (+) Arthritis ,   Abdominal   Peds  Hematology   Anesthesia Other Findings   Reproductive/Obstetrics                             Anesthesia Physical Anesthesia Plan  ASA: III  Anesthesia Plan: General   Post-op Pain Management:    Induction: Intravenous  Airway Management Planned: LMA  Additional Equipment:   Intra-op Plan:   Post-operative Plan:   Informed Consent: I have reviewed the patients History and Physical, chart, labs and discussed the procedure including the risks, benefits and alternatives for the proposed anesthesia with the patient or authorized representative who has indicated his/her understanding and acceptance.     Plan Discussed with: CRNA, Anesthesiologist and Surgeon  Anesthesia Plan Comments:         Anesthesia Quick Evaluation

## 2015-02-10 NOTE — Interval H&P Note (Signed)
History and Physical Interval Note:  02/10/2015 11:59 AM  Gail Manning  has presented today for surgery, with the diagnosis of DCIS right breast  The various methods of treatment have been discussed with the patient and family. After consideration of risks, benefits and other options for treatment, the patient has consented to  Procedure(s): RIGHT BREAST LUMPECTOMY WITH NEEDLE LOCALIZATION (Right) as a surgical intervention .  The patient's history has been reviewed, patient examined, no change in status, stable for surgery.  I have reviewed the patient's chart and labs.  Questions were answered to the patient's satisfaction.     [name]

## 2015-02-10 NOTE — Anesthesia Postprocedure Evaluation (Signed)
Anesthesia Post Note  Patient: Gail Manning  Procedure(s) Performed: Procedure(s) (LRB): RIGHT BREAST LUMPECTOMY WITH NEEDLE LOCALIZATION (Right)  Patient location during evaluation: PACU Anesthesia Type: General Level of consciousness: awake and alert and patient cooperative Pain management: pain level controlled Vital Signs Assessment: post-procedure vital signs reviewed and stable Respiratory status: spontaneous breathing and respiratory function stable Cardiovascular status: stable Anesthetic complications: no    Last Vitals:  Filed Vitals:   02/10/15 1415 02/10/15 1428  BP: 118/65 121/61  Pulse: 70 68  Temp:    Resp: 12 12    Last Pain:  Filed Vitals:   02/10/15 1431  PainSc: 3                  Madiha Bambrick S

## 2015-02-10 NOTE — Discharge Instructions (Signed)
[facility] °Office Phone Number [phone] ° °BREAST BIOPSY/ PARTIAL MASTECTOMY: POST OP INSTRUCTIONS ° °Always review your discharge instruction sheet given to you by the facility where your surgery was performed. ° °IF YOU HAVE DISABILITY OR FAMILY LEAVE FORMS, YOU MUST BRING THEM TO THE OFFICE FOR PROCESSING.  DO NOT GIVE THEM TO YOUR DOCTOR. ° °1. A prescription for pain medication may be given to you upon discharge.  Take your pain medication as prescribed, if needed.  If narcotic pain medicine is not needed, then you may take acetaminophen (Tylenol) or ibuprofen (Advil) as needed. °2. Take your usually prescribed medications unless otherwise directed °3. If you need a refill on your pain medication, please contact your pharmacy.  They will contact our office to request authorization.  Prescriptions will not be filled after 5pm or on week-ends. °4. You should eat very light the first 24 hours after surgery, such as soup, crackers, pudding, etc.  Resume your normal diet the day after surgery. °5. Most patients will experience some swelling and bruising in the breast.  Ice packs and a good support bra will help.  Swelling and bruising can take several days to resolve.  °6. It is common to experience some constipation if taking pain medication after surgery.  Increasing fluid intake and taking a stool softener will usually help or prevent this problem from occurring.  A mild laxative (Milk of Magnesia or Miralax) should be taken according to package directions if there are no bowel movements after 48 hours. °7. Unless discharge instructions indicate otherwise, you may remove your bandages 24-48 hours after surgery, and you may shower at that time.  You may have steri-strips (small skin tapes) in place directly over the incision.  These strips should be left on the skin for 7-10 days.  If your surgeon used skin glue on the incision, you may shower in 24 hours.  The glue will flake off over the  next 2-3 weeks.  Any sutures or staples will be removed at the office during your follow-up visit. °8. ACTIVITIES:  You may resume regular daily activities (gradually increasing) beginning the next day.  Wearing a good support bra or sports bra minimizes pain and swelling.  You may have sexual intercourse when it is comfortable. °a. You may drive when you no longer are taking prescription pain medication, you can comfortably wear a seatbelt, and you can safely maneuver your car and apply brakes. °b. RETURN TO WORK:  ______________________________________________________________________________________ °9. You should see your doctor in the office for a follow-up appointment approximately two weeks after your surgery.  Your doctor’s nurse will typically make your follow-up appointment when she calls you with your pathology report.  Expect your pathology report 2-3 business days after your surgery.  You may call to check if you do not hear from us after three days. °10. OTHER INSTRUCTIONS: _______________________________________________________________________________________________ _____________________________________________________________________________________________________________________________________ °_____________________________________________________________________________________________________________________________________ °_____________________________________________________________________________________________________________________________________ ° °WHEN TO CALL YOUR DOCTOR: °1. Fever over 101.0 °2. Nausea and/or vomiting. °3. Extreme swelling or bruising. °4. Continued bleeding from incision. °5. Increased pain, redness, or drainage from the incision. ° °The clinic staff is available to answer your questions during regular business hours.  Please don’t hesitate to call and ask to speak to one of the nurses for clinical concerns.  If you have a medical emergency, go to the nearest  emergency room or call 911.  A surgeon from [facility] is always on call at the hospital. ° °For further questions, please visit [email or web address]  ° ° ° °  Post Anesthesia Home Care Instructions ° °Activity: °Get plenty of rest for the remainder of the day. A responsible adult should stay with you for 24 hours following the procedure.  °For the next 24 hours, DO NOT: °-Drive a car °-Operate machinery °-Drink alcoholic beverages °-Take any medication unless instructed by your physician °-Make any legal decisions or sign important papers. ° °Meals: °Start with liquid foods such as gelatin or soup. Progress to regular foods as tolerated. Avoid greasy, spicy, heavy foods. If nausea and/or vomiting occur, drink only clear liquids until the nausea and/or vomiting subsides. Call your physician if vomiting continues. ° °Special Instructions/Symptoms: °Your throat may feel dry or sore from the anesthesia or the breathing tube placed in your throat during surgery. If this causes discomfort, gargle with warm salt water. The discomfort should disappear within 24 hours. ° °If you had a scopolamine patch placed behind your ear for the management of post- operative nausea and/or vomiting: ° °1. The medication in the patch is effective for 72 hours, after which it should be removed.  Wrap patch in a tissue and discard in the trash. Wash hands thoroughly with soap and water. °2. You may remove the patch earlier than 72 hours if you experience unpleasant side effects which may include dry mouth, dizziness or visual disturbances. °3. Avoid touching the patch. Wash your hands with soap and water after contact with the patch. °  ° °

## 2015-02-10 NOTE — Transfer of Care (Signed)
Immediate Anesthesia Transfer of Care Note  Patient: Gail Manning  Procedure(s) Performed: Procedure(s): RIGHT BREAST LUMPECTOMY WITH NEEDLE LOCALIZATION (Right)  Patient Location: PACU  Anesthesia Type:General  Level of Consciousness: sedated  Airway & Oxygen Therapy: Patient Spontanous Breathing and Patient connected to face mask oxygen  Post-op Assessment: Report given to RN and Post -op Vital signs reviewed and stable  Post vital signs: Reviewed and stable  Last Vitals:  Filed Vitals:   02/10/15 1111  BP: 165/70  Pulse: 78  Temp: 36.8 C  Resp: 18    Complications: No apparent anesthesia complications

## 2015-02-10 NOTE — Anesthesia Procedure Notes (Signed)
Procedure Name: LMA Insertion Performed by: Elzie Knisley, [place] Pre-anesthesia Checklist: Patient identified, Emergency Drugs available, Suction available and Patient being monitored Patient Re-evaluated:Patient Re-evaluated prior to inductionOxygen Delivery Method: Circle System Utilized Preoxygenation: Pre-oxygenation with 100% oxygen Intubation Type: IV induction Ventilation: Mask ventilation without difficulty LMA: LMA inserted LMA Size: 4.0 Number of attempts: 1 Airway Equipment and Method: Bite block Placement Confirmation: positive ETCO2 Tube secured with: Tape Dental Injury: Teeth and Oropharynx as per pre-operative assessment      

## 2015-02-13 ENCOUNTER — Encounter (HOSPITAL_BASED_OUTPATIENT_CLINIC_OR_DEPARTMENT_OTHER): Payer: Self-pay | Admitting: General Surgery

## 2015-02-22 ENCOUNTER — Encounter: Payer: Self-pay | Admitting: *Deleted

## 2015-02-22 NOTE — Progress Notes (Signed)
error 

## 2015-02-23 ENCOUNTER — Ambulatory Visit
Admit: 2015-02-23 | Discharge: 2015-02-23 | Disposition: A | Discharge status: Home / Self Care | Payer: Self-pay | Attending: Radiation Oncology | Admitting: Radiation Oncology

## 2015-02-23 ENCOUNTER — Encounter: Payer: Self-pay | Admitting: Radiation Oncology

## 2015-02-23 VITALS — BP 137/65 | HR 68 | Temp 98.1°F | Ht 62.0 in | Wt 132.7 lb

## 2015-02-23 DIAGNOSIS — Z170 Estrogen receptor positive status [ER+]: Secondary | ICD-10-CM | POA: Insufficient documentation

## 2015-02-23 DIAGNOSIS — C50111 Malignant neoplasm of central portion of right female breast: Secondary | ICD-10-CM | POA: Insufficient documentation

## 2015-02-23 DIAGNOSIS — Z510 Encounter for antineoplastic radiation therapy: Secondary | ICD-10-CM | POA: Insufficient documentation

## 2015-02-23 NOTE — Progress Notes (Signed)
Location of Breast Cancer:Breast Cancer Lower Outer Quadrant Right Breast  Histology per Pathology Report: 02-10-15 Diagnosis 1. Breast, lumpectomy, right - DUCTAL CARCINOMA IN SITU WITH CALCIFICATIONS, INTERMEDIATE GRADE. - DUCTAL CARCINOMA IN SITU COMES TO WITHIN <0.1 CM OF THE ANTERIOR MARGIN FOCALLY. - BIOPSY SITE CHANGE. - FIBROCYSTIC CHANGE AND SCLEROSING ADENOSIS. - SEE ONCOLOGY TABLE. 2. Breast, excision, right further lateral marginRecent screening mammogram revealed a new area of calcifications in the right breast  - BENIGN BREAST TISSUE WITH USUAL DUCTAL HYPERPLASIA. - NO CARCINOMA IDENTIFIED. 3. Breast, excision, right further inferior margin - BENIGN BREAST TISSUE WITH FIBROCYSTIC CHANGE. - NO CARCINOMA IDENTIFIED. 4. Breast, excision, right shave of lateral margin - BENIGN BREAST TISSUE. - NO CARCINOMA IDENTIFIED. Microscopic Comment 1. BREAST, IN SITU CARCINOMA Specimen, including laterality: Right breast lump and additional Receptor Status: ER(100%), PR (30%), Her2-neu ()  Mrs. Padilla-Luna  Had a recent screening mammogram which revealed a new area of calcifications in the right breast.  Past/Anticipated interventions by surgeon, if any:02-10-15 Right Lumpectomy  Past/Anticipated interventions by medical oncology, if any: Antiestrogen therapy with tamoxifen or anastrozole, 01-2015 Mammogramm   Lymphedema issues, if any: No  Pain issues, if any:  No  Skin:Right breast has some swelling right side with itching and tenderness feels like something is moving once in a while in her breast and biting and pucking needle in her right breast. Recent left hearing isues: Still has some problems with hearing left ear no improvement.  Has seen PCP only will see ENT after she is settled more with her cancer treatment. 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 No  Pacemaker/ICD? No  Possible current pregnancy?No  Is the patient on  methotrexate? No  Current Complaints / other details:  GYN HISTORY   Menarchal: 17 LMP: 53 Contraceptive: No, HRT 4-5 months     G6P3: (+) breast feeding    BP 137/65 mmHg  Pulse 68  Temp(Src) 98.1 F (36.7 C)  Ht _0  (1.575 m)  Wt 132 lb 11.2 oz (60.192 kg)  BMI 24.26 kg/m2  SpO2 100% Georgena Spurling, RN 02/23/2015,9:50 AM

## 2015-02-23 NOTE — Progress Notes (Signed)
  Radiation Oncology         [phone]) [phone] ________________________________  Initial outpatient Consultation - Date: 02/23/2015   Name: Gail Manning MRN: [id number]   DOB: 1945-06-23  REFERRING PHYSICIAN: Alyssa Grove, MD  DIAGNOSIS AND STAGE: 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, right   Staging form: Breast, AJCC 7th Edition     Clinical stage from 01/16/2015: Stage 0 (Tis (DCIS), N0, M0) - Signed by Truitt Merle, MD on 01/25/2015   HISTORY OF 2PRESENT ILLNESS::Gail Manning is a 70 y.o. female with Stage 0 (Tis (DCIS), N0, M0) cancer of the central portion of the right female breast. She had a lumpectomy on 02/10/15. This showed 6 mm of intermediate-grade DCIS. The anterior margin was focally close but that was skin and the surgeon feels that this margin was negative. Her tumor was ER+. She has an appointment with Dr. Burr Medico tomorrow. Today, she reports that she is taking 1 tylenol every 4 hours manage surgical incision pain. She reports that she has some swelling of the right breast along with itching and tenderness. She reports that her right breast often feels as if something is "biting or needle pricking" the area. She also reports feeling weak and  fatigued since her surgical procedure. She denies lymphedema.An interpreter and her husband are present with her.   PREVIOUS RADIATION THERAPY: No  Past medical, social and family history were reviewed in the electronic chart. Review of symptoms was reviewed in the electronic chart. Medications were reviewed in the electronic chart.   PHYSICAL EXAM:  Filed Vitals:   02/23/15 0933  BP: 137/65  Pulse: 68  Temp: 98.1 F (36.7 C)  .132 lb 11.2 oz (60.192 kg).   Positive for an incision over the lateral areola of the right breast. There is some edema around the breast.   IMPRESSION: 70 y.o female with Stage 0 (Tis (DCIS), N0, M0) cancer of central portion of the right female breast  PLAN: Gail Manning was present with a  spanish interpreter today. She has an appointment with Dr. Excell Seltzer on 03/10/2015. I would like to speak with him before moving forward with radiation treatment. CT simulation planning scan is scheduled for 03/21/2015 at 3:00 pm. Informed consent was reviewed and signed by her husband today.   I spoke to the patient today regarding her diagnosis and options for treatment. We discussed the equivalence in terms of survival and local failure between mastectomy and breast conservation. We discussed the role of radiation in decreasing local failures in patients who undergo lumpectomy. We discussed the process of simulation and the placement tattoos. We discussed 4 weeks of treatment as an outpatient. We discussed the possibility of asymptomatic lung damage. We discussed the low likelihood of secondary malignancies. We discussed the possible side effects including but not limited to skin redness, fatigue, permanent skin darkening, and breast swelling.   I spent 15 minutes  face to face with the patient and more than 50% of that time was spent in counseling and/or coordination of care.       ------------------------------------------------  Thea Silversmith, MD  This document serves as a record of services personally performed by Thea Silversmith, MD. It was created on her behalf by Jenell Milliner, a trained medical scribe. The creation of this record is based on the scribe's personal observations and the provider's statements to them. This document has been checked and approved by the attending provider.

## 2015-02-24 ENCOUNTER — Telehealth: Payer: Self-pay | Admitting: Hematology

## 2015-02-24 ENCOUNTER — Ambulatory Visit (HOSPITAL_BASED_OUTPATIENT_CLINIC_OR_DEPARTMENT_OTHER): Payer: Self-pay | Admitting: Hematology

## 2015-02-24 VITALS — BP 136/55 | HR 86 | Temp 98.4°F | Resp 17 | Ht 62.0 in | Wt 132.9 lb

## 2015-02-24 DIAGNOSIS — F329 Major depressive disorder, single episode, unspecified: Secondary | ICD-10-CM

## 2015-02-24 DIAGNOSIS — D0511 Intraductal carcinoma in situ of right breast: Secondary | ICD-10-CM

## 2015-02-24 DIAGNOSIS — E119 Type 2 diabetes mellitus without complications: Secondary | ICD-10-CM

## 2015-02-24 DIAGNOSIS — I10 Essential (primary) hypertension: Secondary | ICD-10-CM

## 2015-02-24 DIAGNOSIS — C50111 Malignant neoplasm of central portion of right female breast: Secondary | ICD-10-CM

## 2015-02-24 NOTE — Telephone Encounter (Signed)
per pof to sch pt appt-gave pt copy of avs °

## 2015-02-24 NOTE — Addendum Note (Signed)
Encounter addended by: Malena Edman, RN on: 02/24/2015 12:07 PM<BR>     Documentation filed: Charges VN

## 2015-02-24 NOTE — Progress Notes (Signed)
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 Spanish-speaking female is here because of her recently diagnosed right breast cancer. She is accompanied her husband and interpreter Gregary Signs to our multidisciplinary breast clinic today.  This was discovered by screening mammogram. She had no palpable breast mass, no skin or nipple change. She denies any pain, or other new symptoms. She has good appetite and energy level, no recent weight loss. She recently had some left hearing issue, will see a ENT. 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 She lives with her husband, does not exercise regularly, but is physically active.    GYN HISTORY  Menarchal: 17 LMP: 53 Contraceptive: No       HRT: 4-5 months  G6P3: (+) breast feeding   CURRENT THERAPY: pending radiation   INTERIM HISTORY:  She returns for followup. She had right lumpectomy on  2/3, still has intermittent right side chest pain at surgical site, takes tylenol every 4 hours. No right arm swelling or other complains. She is concerned about her breast cancer, reports to be depressed, but finally cheered up after I told her that her DCIS is cured.   MEDICAL HISTORY:  Past Medical History  Diagnosis Date  . Hypertension   . Hyperlipidemia   . Diabetes mellitus without complication ([place])   . 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 Breast cancer [facility])     SURGICAL HISTORY: Past Surgical History  Procedure Laterality Date  . Cesarean section      x3  . Breast lumpectomy with needle localization Right 02/10/2015    Procedure: RIGHT BREAST LUMPECTOMY WITH NEEDLE LOCALIZATION;  Surgeon: Excell Seltzer, MD;  Location: [place];  Service: General;  Laterality: Right;    SOCIAL HISTORY: Social History   Social History  . Marital Status: Married    Spouse Name: N/A  . Number of Children: N/A  . Years of Education: N/A   Occupational History  . Not on file.   Social History Main Topics  . Smoking status: Never Smoker   . Smokeless tobacco: Never Used  . Alcohol Use: No  . Drug Use: No  . Sexual Activity: Not on file   Other Topics Concern  . Not on file   Social History Narrative    FAMILY HISTORY: Family History  Problem Relation Age of Onset  . Diabetes Mother   . Hypertension Mother   .  Rheum arthritis Mother   . Cancer Father     colon  . Diabetes Father   . Hypertension Father   . Breast cancer Sister   . Hypertension Sister     ALLERGIES:  is allergic to albuterol sulfate.  MEDICATIONS:  Current Outpatient Prescriptions  Medication Sig Dispense Refill  . Alogliptin Benzoate (NESINA) 25 MG TABS Take 25 mg by mouth daily after supper.    . calcium & magnesium carbonates (MYLANTA) 333-832 MG tablet Take 3 tablets by mouth daily.    Marland Kitchen HYDROcodone-acetaminophen (NORCO/VICODIN) 5-325 MG tablet Take  1-2 tablets by mouth every 4 (four) hours as needed for moderate pain or severe pain. 30 tablet 0  . lisinopril (PRINIVIL,ZESTRIL) 20 MG tablet Take 20 mg by mouth daily.    Marland Kitchen lovastatin (MEVACOR) 20 MG tablet Take 20 mg by mouth at bedtime.    . metFORMIN (GLUCOPHAGE) 1000 MG tablet Take 1,000 mg by mouth 2 (two) times daily with a meal. Reported on 01/25/2015    . naproxen sodium (ALEVE) 220 MG tablet Take 440 mg by mouth 2 (two) times daily with a meal.    . [place], Dioscorea [place], EXTR Apply topically every evening.     No current facility-administered medications for this visit.    REVIEW OF SYSTEMS:   Constitutional: Denies fevers, chills or abnormal night sweats Eyes: Denies blurriness of vision, double vision or watery eyes Ears, nose, mouth, throat, and face: Denies mucositis or sore throat Respiratory: Denies cough, dyspnea or wheezes Cardiovascular: Denies palpitation, chest discomfort or lower extremity swelling Gastrointestinal:  Denies nausea, heartburn or change in bowel habits Skin: Denies abnormal skin rashes Lymphatics: Denies new lymphadenopathy or easy bruising Neurological:Denies numbness, tingling or new weaknesses Behavioral/Psych: Mood is stable, no new changes  All other systems were reviewed with the patient and are negative.  PHYSICAL EXAMINATION: ECOG PERFORMANCE STATUS: 1 - Symptomatic but completely ambulatory  Filed Vitals:   02/24/15 1518  BP: 136/55  Pulse: 86  Temp: 98.4 F (36.9 C)  Resp: 17   Filed Weights   02/24/15 1518  Weight: 132 lb 14.4 oz (60.283 kg)    GENERAL:alert, no distress and comfortable SKIN: skin color, texture, turgor are normal, no rashes or significant lesions EYES: normal, conjunctiva are pink and non-injected, sclera clear OROPHARYNX:no exudate, no erythema and lips, buccal mucosa, and tongue normal  NECK: supple, thyroid normal size, non-tender, without nodularity LYMPH:  no palpable lymphadenopathy in the  cervical, axillary or inguinal LUNGS: clear to auscultation and percussion with normal breathing effort HEART: regular rate & rhythm and no murmurs and no lower extremity edema ABDOMEN:abdomen soft, non-tender and normal bowel sounds Musculoskeletal:no cyanosis of digits and no clubbing  PSYCH: alert & oriented x 3 with fluent speech NEURO: no focal motor/sensory deficits Breasts: Breast inspection showed them to be symmetrical with no nipple discharge. Surgical scar in the right breast is healing well, still tender, no other palpable mass in both breasts and axilla.  LABORATORY DATA:  I have reviewed the data as listed Lab Results  Component Value Date   WBC 5.2 01/25/2015   HGB 12.4 01/25/2015   HCT 36.6 01/25/2015   MCV 90.0 01/25/2015   PLT 182 01/25/2015    Recent Labs  01/25/15 0845  NA 136  K 3.8  CO2 24  GLUCOSE 140  BUN 8.4  CREATININE 0.7  CALCIUM 9.3  PROT 7.1  ALBUMIN 4.1  AST 29  ALT 35  ALKPHOS 54  BILITOT  0.51    PATHOLOGY REPORT  Diagnosis 02/10/2015 1. Breast, lumpectomy, right - DUCTAL CARCINOMA IN SITU WITH CALCIFICATIONS, INTERMEDIATE GRADE. - DUCTAL CARCINOMA IN SITU COMES TO WITHIN <0.1 CM OF THE ANTERIOR MARGIN FOCALLY. - BIOPSY SITE CHANGE. - FIBROCYSTIC CHANGE AND SCLEROSING ADENOSIS. - SEE ONCOLOGY TABLE. 2. Breast, excision, right further lateral margin - BENIGN BREAST TISSUE WITH USUAL DUCTAL HYPERPLASIA. - NO CARCINOMA IDENTIFIED. 3. Breast, excision, right further inferior margin - BENIGN BREAST TISSUE WITH FIBROCYSTIC CHANGE. - NO CARCINOMA IDENTIFIED. 4. Breast, excision, right shave of lateral margin - BENIGN BREAST TISSUE. - NO CARCINOMA IDENTIFIED.  Microscopic Comment 1. BREAST, IN SITU CARCINOMA Specimen, including laterality: Right breast lump and additional margins. Procedure (include lymph node sampling sentinel-non-sentinel): Right breast lumpectomy and additional margin resection. Grade of carcinoma: II Necrosis:  Single cell. Estimated tumor size: (block estimate): 0.6 cm, see comment. Treatment effect: N/A. Distance to closest margin: <0.1 cm from anterior margin focally. If margin positive, focally or broadly: See above. Breast prognostic profile: [id number], see below. Will not be repeated. Estrogen receptor: Positive, 100%. Progesterone receptor: Positive, 30%. Lymph nodes: Not sampled. TNM: pTis, pNX Comments: There is DCIS within 2 blocks and thus the size is estimated at 0.6 cm (0.3 cm per block). Immunohistochemistry was performed on one block and shows preserved basal markers (p63, smooth muscle myosin, and calponin).  RADIOGRAPHIC STUDIES: I have personally reviewed the radiological images as listed and agreed with the findings in the report. No results found.  ASSESSMENT & PLAN:  70 year-old postmenopausal woman, presented with screening discovered DCIS.  1. Right breast DCIS, intermediate grade, ER/PR strongly positive -I discussed her breast lumpectomy surgical pathology results with patient and her husband in great detail. --Her DCIS will be cured by complete surgical resection. The surgical margins were negative, cleared by her surgeon. Any form of adjuvant therapy is preventive. -Given her strongly positive ER and PR, I recommend antiestrogen therapy with tamoxifen or anastrozole, which decrease her risk of future breast cancer by ~40%. Benefit and potential side effects of these drugs were discussed with her and her family members. She is interested. -She will likely benefit from breast radiation if she undergo lumpectomy to decrease the risk of breast cancer. -We discussed breast cancer surveillance after she completes treatment, Including annual mammogram, breast exam every 6-12 months.  2. HTN, DM -she will continue follow-up with her primary care physician  3. Depression -She reports to be depressed, mainly due to the concern of her breast cancer -After the discussions  that her breast cancer has been cured, and the risk of second rest cancer and a preventive strategies, she feels much better. -I encouraged her to be positive, we'll continue monitor clinically.  Plan -She will start breast radiation soon  -I'll see her back in 2 months to start her endocrine therapy after she completes radiation.   All questions were answered. The patient knows to call the clinic with any problems, questions or concerns. I spent 20 minutes counseling the patient face to face. The total time spent in the appointment was 25 minutes and more than 50% was on counseling.     Truitt Merle, MD 02/24/2015

## 2015-02-25 ENCOUNTER — Encounter: Payer: Self-pay | Admitting: Hematology

## 2015-02-28 ENCOUNTER — Telehealth: Payer: Self-pay | Admitting: *Deleted

## 2015-02-28 ENCOUNTER — Other Ambulatory Visit: Payer: Self-pay | Admitting: *Deleted

## 2015-02-28 MED ORDER — PROCHLORPERAZINE MALEATE 10 MG PO TABS: 10.0000 mg | ORAL_TABLET | Freq: Four times a day (QID) | ORAL | Status: DC | PRN

## 2015-02-28 NOTE — Telephone Encounter (Signed)
Pt came in reporting that she has been vomiting from pain meds that were prescribed & wants to see if Dr will prescribe different pain med per interpreter, Almyra Free & may need to see Selena Lesser NP if possible.  Talked with pt & young female who interpreted before Almyra Free came in.  Pt took 3 doses of vicodin & has vomited with every dose.  Last dose taken on Sat & vomited last time on Sun eve.  She is concerned that she has done something to her R breast surgery.  Observed R breast & no drainage, redness, or obvious swelling or warmth.  She feels like it is swollen some since vomiting.  Discussed with Selena Lesser NP & nausea med e-scribed & pt instructed to not take any more vicodin & to try tylenol or advil & we will see if we can get her back in to see Dr Excell Seltzer tomorrow.

## 2015-03-21 ENCOUNTER — Ambulatory Visit
Admission: RE | Admit: 2015-03-21 | Discharge: 2015-03-21 | Disposition: A | Discharge status: Home / Self Care | Payer: Self-pay | Source: Ambulatory Visit | Attending: Radiation Oncology | Admitting: Radiation Oncology

## 2015-03-21 DIAGNOSIS — C50111 Malignant neoplasm of central portion of right female breast: Secondary | ICD-10-CM

## 2015-03-21 MED ORDER — GABAPENTIN 300 MG PO CAPS: 300.0000 mg | ORAL_CAPSULE | Freq: Every day | ORAL | Status: DC

## 2015-03-21 NOTE — Progress Notes (Signed)
Radiation Oncology         [phone] ________________________________  Name: Gail Manning      MRN: [id number]          Date: 03/21/2015              DOB: [birth year]  Optical Surface Tracking Plan:  Since intensity modulated radiotherapy (IMRT) and 3D conformal radiation treatment methods are predicated on accurate and precise positioning for treatment, intrafraction motion monitoring is medically necessary to ensure accurate and safe treatment delivery.  The ability to quantify intrafraction motion without excessive ionizing radiation dose can only be performed with optical surface tracking. Accordingly, surface imaging offers the opportunity to obtain 3D measurements of patient position throughout IMRT and 3D treatments without excessive radiation exposure.  I am ordering optical surface tracking for this patient's upcoming course of radiotherapy. ________________________________ Signature   Reference:   Ursula Alert, J, et al. Surface imaging-based analysis of intrafraction motion for breast radiotherapy patients.Journal of [employer], n. 6, nov. 2014. ISSN [id number].   Available at: <http://www.jacmp.org/index.php/jacmp/article/view/4957>.

## 2015-03-21 NOTE — Progress Notes (Signed)
Name: Gail Manning   MRN: [id number]  Date:  03/21/2015  DOB: 1945-06-02  Status:outpatient    DIAGNOSIS: Breast cancer.  CONSENT VERIFIED: yes   SET UP: Patient is setup supine   IMMOBILIZATION:  The following immobilization was used:Custom Moldable Pillow, breast board.   NARRATIVE: Ms. Gail Manning was brought to the [place].  Identity was confirmed.  All relevant records and images related to the planned course of therapy were reviewed.  Then, the patient was positioned in a stable reproducible clinical set-up for radiation therapy.  Wires were placed to delineate the clinical extent of breast tissue. A wire was placed on the scar as well.  CT images were obtained.  An isocenter was placed. Skin markings were placed.  The CT images were loaded into the planning software where the target and avoidance structures were contoured.  The radiation prescription was entered and confirmed. The patient was discharged in stable condition and tolerated simulation well.    TREATMENT PLANNING NOTE:  Treatment planning then occurred. I have requested : MLC's, isodose plan, basic dose calculation  I personally designed and supervised the construction of 5 medically necessary complex treatment devices for the protection of critical normal structures including the lungs and contralateral breast as well as the immobilization device which is necessary for set up certainty.   3D simulation occurred. I requested and analyzed a dose volume histogram of the heart, lungs and lumpectomy cavity.   She was complaining of breast pain. Narcotics were helping but making her nauseated. I prescribed gabapentin. We will start with 300 at night

## 2015-03-28 ENCOUNTER — Ambulatory Visit
Admission: RE | Admit: 2015-03-28 | Discharge: 2015-03-28 | Disposition: A | Discharge status: Home / Self Care | Payer: Self-pay | Source: Ambulatory Visit | Attending: Radiation Oncology | Admitting: Radiation Oncology

## 2015-03-29 ENCOUNTER — Ambulatory Visit
Admission: RE | Admit: 2015-03-29 | Discharge: 2015-03-29 | Disposition: A | Discharge status: Home / Self Care | Payer: Self-pay | Source: Ambulatory Visit | Attending: Radiation Oncology | Admitting: Radiation Oncology

## 2015-03-30 ENCOUNTER — Ambulatory Visit
Admission: RE | Admit: 2015-03-30 | Discharge: 2015-03-30 | Disposition: A | Discharge status: Home / Self Care | Payer: Self-pay | Source: Ambulatory Visit | Attending: Radiation Oncology | Admitting: Radiation Oncology

## 2015-03-31 ENCOUNTER — Ambulatory Visit
Admission: RE | Admit: 2015-03-31 | Discharge: 2015-03-31 | Disposition: A | Discharge status: Home / Self Care | Payer: Self-pay | Source: Ambulatory Visit | Attending: Radiation Oncology | Admitting: Radiation Oncology

## 2015-04-03 ENCOUNTER — Ambulatory Visit
Admission: RE | Admit: 2015-04-03 | Discharge: 2015-04-03 | Disposition: A | Discharge status: Home / Self Care | Payer: Self-pay | Source: Ambulatory Visit | Attending: Radiation Oncology | Admitting: Radiation Oncology

## 2015-04-04 ENCOUNTER — Ambulatory Visit
Admission: RE | Admit: 2015-04-04 | Discharge: 2015-04-04 | Disposition: A | Discharge status: Home / Self Care | Payer: Self-pay | Source: Ambulatory Visit | Attending: Radiation Oncology | Admitting: Radiation Oncology

## 2015-04-04 ENCOUNTER — Encounter: Payer: Self-pay | Admitting: Radiation Oncology

## 2015-04-04 VITALS — BP 136/42 | HR 80 | Temp 98.1°F | Resp 16 | Ht 62.0 in | Wt 133.5 lb

## 2015-04-04 DIAGNOSIS — N644 Mastodynia: Secondary | ICD-10-CM | POA: Insufficient documentation

## 2015-04-04 DIAGNOSIS — Z923 Personal history of irradiation: Secondary | ICD-10-CM | POA: Insufficient documentation

## 2015-04-04 DIAGNOSIS — C50911 Malignant neoplasm of unspecified site of right female breast: Secondary | ICD-10-CM | POA: Insufficient documentation

## 2015-04-04 DIAGNOSIS — C50111 Malignant neoplasm of central portion of right female breast: Secondary | ICD-10-CM

## 2015-04-04 MED ORDER — RADIAPLEXRX EX GEL
Freq: Once | CUTANEOUS | Status: AC
  Administered 2015-04-04: 16:00:00 via TOPICAL

## 2015-04-04 MED ORDER — ALRA NON-METALLIC DEODORANT (RAD-ONC)
1.0000 "application " | Freq: Once | TOPICAL | Status: AC
  Administered 2015-04-04: 1 via TOPICAL

## 2015-04-04 NOTE — Addendum Note (Signed)
Encounter addended by: Malena Edman, RN on: 04/04/2015  4:22 PM<BR>     Documentation filed: Notes Section, Inpatient Patient Education

## 2015-04-04 NOTE — Addendum Note (Signed)
Encounter addended by: Malena Edman, RN on: 04/04/2015  4:32 PM<BR>     Documentation filed: Visit Diagnoses, Dx Association, Inpatient MAR, Orders

## 2015-04-04 NOTE — Progress Notes (Signed)
Weekly Management Note Current Dose: 13.35  Gy  Projected Dose:42.72  Gy   Narrative:  The patient presents for routine under treatment assessment.  CBCT/MVCT images/Port film x-rays were reviewed.  The chart was checked. Breast pain continues. Not taking neurontin. Daughter and interpreter present.   Physical Findings: Weight: 133 lb 8 oz (60.555 kg). Unchanged  Impression:  The patient is tolerating radiation.  Plan:  Continue treatment as planned.add nursing pads if needed. Try neurontin.

## 2015-04-04 NOTE — Addendum Note (Signed)
Encounter addended by: Malena Edman, RN on: 04/04/2015  4:40 PM<BR>     Documentation filed: Inpatient Patient Education, Visit Diagnoses, Orders, Dx Association, Inpatient [facility]

## 2015-04-04 NOTE — Progress Notes (Addendum)
Mrs. Anibal Henderson has received 5 fractions to her right breast.  Skin to the right breast with normal color pink.  Instructions given to use Radiaplex gel twice a day and deodorant instructions Interpreter with patient and daughter heard the instructions.  Appetite has been altered it is okay today.  Energy level decreased having fatigue all during the day.  Pain to bilateral hips and knees 8/10 has Hydrocodone for pain will not take the pain medication. BP 136/42 mmHg  Pulse 80  Temp(Src) 98.1 F (36.7 C) (Oral)  Resp 16  Ht 5\' 2"  (1.575 m)  Wt 133 lb 8 oz (60.555 kg)  BMI 24.41 kg/m2  SpO2 99%  Pt here for patient teaching.  Pt given given Radiation and you book. Pt reports they have  the Radiation Therapy Education video on 04-04-15 daughter}.  Reviewed areas of pertinence such as fatigue, hair loss, skin changes, breast tenderness and breast swelling . Pt able to give teach back of ,apply Radiaplex bid, avoid applying anything to skin within 4 hours of treatment, avoid wearing an under wire bra and to use an electric razor if they must shave. Pt demonstrated understanding of information given and will contact nursing with any questions or concerns.     Http://rtanswers.org/treatmentinformation/whattoexpect/index

## 2015-04-05 ENCOUNTER — Ambulatory Visit
Admission: RE | Admit: 2015-04-05 | Discharge: 2015-04-05 | Disposition: A | Discharge status: Home / Self Care | Payer: Self-pay | Source: Ambulatory Visit | Attending: Radiation Oncology | Admitting: Radiation Oncology

## 2015-04-06 ENCOUNTER — Encounter: Payer: Self-pay | Admitting: Radiation Oncology

## 2015-04-06 ENCOUNTER — Ambulatory Visit
Admission: RE | Admit: 2015-04-06 | Discharge: 2015-04-06 | Disposition: A | Discharge status: Home / Self Care | Payer: Self-pay | Source: Ambulatory Visit | Attending: Radiation Oncology | Admitting: Radiation Oncology

## 2015-04-07 ENCOUNTER — Ambulatory Visit
Admission: RE | Admit: 2015-04-07 | Discharge: 2015-04-07 | Disposition: A | Discharge status: Home / Self Care | Payer: Self-pay | Source: Ambulatory Visit | Attending: Radiation Oncology | Admitting: Radiation Oncology

## 2015-04-10 ENCOUNTER — Ambulatory Visit
Admission: RE | Admit: 2015-04-10 | Discharge: 2015-04-10 | Disposition: A | Discharge status: Home / Self Care | Payer: Self-pay | Source: Ambulatory Visit | Attending: Radiation Oncology | Admitting: Radiation Oncology

## 2015-04-11 ENCOUNTER — Ambulatory Visit
Admission: RE | Admit: 2015-04-11 | Discharge: 2015-04-11 | Disposition: A | Discharge status: Home / Self Care | Payer: Self-pay | Source: Ambulatory Visit | Attending: Radiation Oncology | Admitting: Radiation Oncology

## 2015-04-11 DIAGNOSIS — C50111 Malignant neoplasm of central portion of right female breast: Secondary | ICD-10-CM

## 2015-04-11 NOTE — Progress Notes (Signed)
Weekly Management Note Current Dose:  26.7 Gy  Projected Dose: 52.72 Gy   Narrative:  The patient presents for routine under treatment assessment.  CBCT/MVCT images/Port film x-rays were reviewed.  The chart was checked. Doing well. No pain with gabapentin.  Some dizziness.   Physical Findings: Skin is slightly dark. Examined on tx machine for electron set up.   Impression:  The patient is tolerating radiation.  Plan:  Continue treatment as planned. Continue gabapentin. Continue radiaplex.

## 2015-04-12 ENCOUNTER — Ambulatory Visit
Admission: RE | Admit: 2015-04-12 | Discharge: 2015-04-12 | Disposition: A | Discharge status: Home / Self Care | Payer: Self-pay | Source: Ambulatory Visit | Attending: Radiation Oncology | Admitting: Radiation Oncology

## 2015-04-13 ENCOUNTER — Ambulatory Visit
Admission: RE | Admit: 2015-04-13 | Discharge: 2015-04-13 | Disposition: A | Discharge status: Home / Self Care | Payer: Self-pay | Source: Ambulatory Visit | Attending: Radiation Oncology | Admitting: Radiation Oncology

## 2015-04-14 ENCOUNTER — Ambulatory Visit
Admission: RE | Admit: 2015-04-14 | Discharge: 2015-04-14 | Disposition: A | Discharge status: Home / Self Care | Payer: Self-pay | Source: Ambulatory Visit | Attending: Radiation Oncology | Admitting: Radiation Oncology

## 2015-04-17 ENCOUNTER — Ambulatory Visit
Admission: RE | Admit: 2015-04-17 | Discharge: 2015-04-17 | Disposition: A | Discharge status: Home / Self Care | Payer: Self-pay | Source: Ambulatory Visit | Attending: Radiation Oncology | Admitting: Radiation Oncology

## 2015-04-18 ENCOUNTER — Ambulatory Visit
Admission: RE | Admit: 2015-04-18 | Discharge: 2015-04-18 | Disposition: A | Discharge status: Home / Self Care | Payer: Self-pay | Source: Ambulatory Visit | Attending: Radiation Oncology | Admitting: Radiation Oncology

## 2015-04-18 ENCOUNTER — Encounter: Payer: Self-pay | Admitting: Radiation Oncology

## 2015-04-18 VITALS — BP 127/59 | HR 77 | Temp 97.7°F | Resp 16 | Ht 62.0 in | Wt 134.7 lb

## 2015-04-18 DIAGNOSIS — C50111 Malignant neoplasm of central portion of right female breast: Secondary | ICD-10-CM

## 2015-04-18 NOTE — Progress Notes (Signed)
[name] has received 15 fractions to her right breast.  Skin to right with slight tanning.  Using Radiaplex gel.  Saw Dr. Excell Seltzer today for follow up visit.  Appetite is not good at times.  Experiencing fatigue after the treatment and in the mornings.  Pain 5/10 taking Hydrocodone as needed for pain. BP 127/59 mmHg  Pulse 77  Temp(Src) 97.7 F (36.5 C) (Oral)  Resp 16  Ht 5\' 2"  (1.575 m)  Wt 134 lb 11.2 oz (61.1 kg)  BMI 24.63 kg/m2  SpO2 99%

## 2015-04-18 NOTE — Progress Notes (Signed)
Weekly Management Note Current Dose:  40.05 Gy  Projected Dose: 52.72 Gy   Narrative:  The patient presents for routine under treatment assessment.  CBCT/MVCT images/Port film x-rays were reviewed.  The chart was checked. Doing well. No pain with gabapentin.  Sleeping well. Accompanied by her daughter.   Physical Findings: Skin is slightly dark.   Impression:  The patient is tolerating radiation.  Plan:  Continue treatment as planned. Continue gabapentin. Continue radiaplex.

## 2015-04-19 ENCOUNTER — Ambulatory Visit
Admission: RE | Admit: 2015-04-19 | Discharge: 2015-04-19 | Disposition: A | Discharge status: Home / Self Care | Payer: Self-pay | Source: Ambulatory Visit | Attending: Radiation Oncology | Admitting: Radiation Oncology

## 2015-04-20 ENCOUNTER — Ambulatory Visit
Admission: RE | Admit: 2015-04-20 | Discharge: 2015-04-20 | Disposition: A | Discharge status: Home / Self Care | Payer: Self-pay | Source: Ambulatory Visit | Attending: Radiation Oncology | Admitting: Radiation Oncology

## 2015-04-21 ENCOUNTER — Ambulatory Visit
Admission: RE | Admit: 2015-04-21 | Discharge: 2015-04-21 | Disposition: A | Discharge status: Home / Self Care | Payer: Self-pay | Source: Ambulatory Visit | Attending: Radiation Oncology | Admitting: Radiation Oncology

## 2015-04-24 ENCOUNTER — Ambulatory Visit
Admission: RE | Admit: 2015-04-24 | Discharge: 2015-04-24 | Disposition: A | Discharge status: Home / Self Care | Payer: Self-pay | Source: Ambulatory Visit | Attending: Radiation Oncology | Admitting: Radiation Oncology

## 2015-04-25 ENCOUNTER — Ambulatory Visit
Admission: RE | Admit: 2015-04-25 | Discharge: 2015-04-25 | Disposition: A | Discharge status: Home / Self Care | Payer: Self-pay | Source: Ambulatory Visit | Attending: Radiation Oncology | Admitting: Radiation Oncology

## 2015-04-25 ENCOUNTER — Encounter: Payer: Self-pay | Admitting: Radiation Oncology

## 2015-04-25 VITALS — BP 119/60 | HR 72 | Temp 98.1°F | Resp 16 | Ht 62.0 in | Wt 136.3 lb

## 2015-04-25 DIAGNOSIS — C50111 Malignant neoplasm of central portion of right female breast: Secondary | ICD-10-CM

## 2015-04-25 NOTE — Addendum Note (Signed)
Encounter addended by: Malena Edman, RN on: 04/25/2015  5:57 PM<BR>     Documentation filed: Inpatient Patient Education

## 2015-04-25 NOTE — Progress Notes (Signed)
Weekly Management Note Current Dose:  50.72 Gy  Projected Dose: 52.72 Gy   Narrative:  The patient presents for routine under treatment assessment.  CBCT/MVCT images/Port film x-rays were reviewed.  The chart was checked. Doing well. No pain with gabapentin.  Sleeping well. Accompanied by translator. Has appt with med onc on Friday.   Physical Findings: Skin is slightly dark. No moist desquamation.   Impression:  The patient is tolerating radiation.  Plan:  Continue treatment as planned. Continue gabapentin. Continue radiaplex. Follow up in 1 month. Discussed post RT skin care. Discussed survivorship and need for follow up.

## 2015-04-26 ENCOUNTER — Ambulatory Visit
Admission: RE | Admit: 2015-04-26 | Discharge: 2015-04-26 | Disposition: A | Discharge status: Home / Self Care | Payer: Self-pay | Source: Ambulatory Visit | Attending: Radiation Oncology | Admitting: Radiation Oncology

## 2015-04-26 ENCOUNTER — Encounter: Payer: Self-pay | Admitting: Radiation Oncology

## 2015-04-26 DIAGNOSIS — C50111 Malignant neoplasm of central portion of right female breast: Secondary | ICD-10-CM

## 2015-04-26 MED ORDER — RADIAPLEXRX EX GEL
Freq: Once | CUTANEOUS | Status: AC
  Administered 2015-04-26: 17:00:00 via TOPICAL

## 2015-04-28 ENCOUNTER — Ambulatory Visit (HOSPITAL_BASED_OUTPATIENT_CLINIC_OR_DEPARTMENT_OTHER): Payer: Self-pay | Admitting: Hematology

## 2015-04-28 ENCOUNTER — Encounter: Payer: Self-pay | Admitting: Hematology

## 2015-04-28 ENCOUNTER — Telehealth: Payer: Self-pay | Admitting: Hematology

## 2015-04-28 VITALS — BP 141/77 | HR 90 | Temp 98.3°F | Resp 18 | Ht 62.0 in | Wt 135.3 lb

## 2015-04-28 DIAGNOSIS — C50111 Malignant neoplasm of central portion of right female breast: Secondary | ICD-10-CM

## 2015-04-28 DIAGNOSIS — Z170 Estrogen receptor positive status [ER+]: Secondary | ICD-10-CM

## 2015-04-28 DIAGNOSIS — D0511 Intraductal carcinoma in situ of right breast: Secondary | ICD-10-CM

## 2015-04-28 NOTE — Telephone Encounter (Signed)
Gave pt appt & avs °

## 2015-04-28 NOTE — Progress Notes (Addendum)
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, right   Staging form: Breast, AJCC 7th Edition     Clinical stage from 01/16/2015: Stage 0 (Tis (DCIS), N0, M0) - Signed by Truitt Merle, MD on 01/25/2015     Pathologic stage from 02/10/2015: Stage 0 (Tis (DCIS), N0, cM0) - Signed by Truitt Merle, MD on 04/28/2015        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, right   01/16/2015 Initial Diagnosis 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 Spanish-speaking female is here because of her recently diagnosed right breast cancer. She is accompanied her husband and interpreter Gregary Signs to our multidisciplinary breast clinic today.  This was discovered by screening mammogram. She had no palpable breast mass, no skin or nipple change. She denies any pain, or other new symptoms. She has good appetite and energy level, no recent weight loss. She recently had some  left hearing issue, will see a ENT. 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 She lives with her husband, does not exercise regularly, but is physically active.    GYN HISTORY  Menarchal: 17 LMP: 53 Contraceptive: No       HRT: 4-5 months  G6P3: (+) breast feeding   CURRENT THERAPY: recovering from radiation   INTERIM HISTORY:  She returns for followup. She presents to the clinic with her daughter-in-law and interpreter Gregary Signs. She has completed radiation 2 days ago, tolerated well overall, she did have moderate radiation dermatitis, and the right breast is still sensitive. 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 She has very limited vision, lives with hr husband, able to function (take car of herself) at home. No other new complains.    MEDICAL HISTORY:  Past Medical History  Diagnosis Date  . Hypertension   . Hyperlipidemia   . Diabetes mellitus without complication ([place])   . 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 Breast cancer [facility])     SURGICAL HISTORY: Past Surgical History  Procedure Laterality Date  . Cesarean section      x3  . Breast lumpectomy with needle localization Right 02/10/2015    Procedure: RIGHT BREAST LUMPECTOMY WITH NEEDLE LOCALIZATION;  Surgeon: Excell Seltzer, MD;  Location: [place];  Service: General;  Laterality: Right;    SOCIAL HISTORY: Social History   Social History  . Marital Status: Married    Spouse Name: N/A  . Number of  Children: N/A  . Years of Education: N/A   Occupational History  . Not on file.   Social History Main Topics  . Smoking status: Never Smoker   . Smokeless tobacco: Never Used  . Alcohol Use: No  . Drug Use: No  . Sexual Activity: Not on file   Other Topics Concern  . Not on file   Social History Narrative    FAMILY HISTORY: Family History  Problem Relation Age of Onset  . Diabetes Mother     . Hypertension Mother   . Rheum arthritis Mother   . Cancer Father     colon  . Diabetes Father   . Hypertension Father   . Breast cancer Sister   . Hypertension Sister     ALLERGIES:  is allergic to albuterol sulfate.  MEDICATIONS:  Current Outpatient Prescriptions  Medication Sig Dispense Refill  . calcium & magnesium carbonates (MYLANTA) 962-229 MG tablet Take 3 tablets by mouth daily.    Marland Kitchen gabapentin (NEURONTIN) 300 MG capsule Take 1 capsule (300 mg total) by mouth at bedtime. 30 capsule 1  . lisinopril (PRINIVIL,ZESTRIL) 20 MG tablet Take 20 mg by mouth daily.    Marland Kitchen lovastatin (MEVACOR) 20 MG tablet Take 20 mg by mouth at bedtime.    . metFORMIN (GLUCOPHAGE) 1000 MG tablet Take 1,000 mg by mouth 2 (two) times daily with a meal. Reported on 01/25/2015    . Alogliptin Benzoate (NESINA) 25 MG TABS Take 25 mg by mouth daily after supper.    Marland Kitchen HYDROcodone-acetaminophen (NORCO/VICODIN) 5-325 MG tablet Take 1-2 tablets by mouth every 4 (four) hours as needed for moderate pain or severe pain. 30 tablet 0  . naproxen sodium (ALEVE) 220 MG tablet Take 440 mg by mouth 2 (two) times daily with a meal. Reported on 04/28/2015    . prochlorperazine (COMPAZINE) 10 MG tablet Take 1 tablet (10 mg total) by mouth every 6 (six) hours as needed for nausea or vomiting. (Patient not taking: Reported on 04/25/2015) 20 tablet 1  . [place], [place], [place] Apply topically every evening. Reported on 04/04/2015     No current facility-administered medications for this visit.    REVIEW OF SYSTEMS:   Constitutional: Denies fevers, chills or abnormal night sweats Eyes: Denies blurriness of vision, double vision or watery eyes Ears, nose, mouth, throat, and face: Denies mucositis or sore throat Respiratory: Denies cough, dyspnea or wheezes Cardiovascular: Denies palpitation, chest discomfort or lower extremity swelling Gastrointestinal:  Denies nausea, heartburn or change in bowel habits Skin: Denies  abnormal skin rashes Lymphatics: Denies new lymphadenopathy or easy bruising Neurological:Denies numbness, tingling or new weaknesses Behavioral/Psych: Mood is stable, no new changes  All other systems were reviewed with the patient and are negative.  PHYSICAL EXAMINATION: ECOG PERFORMANCE STATUS: 1 - Symptomatic but completely ambulatory  Filed Vitals:   04/28/15 1515  BP: 141/77  Pulse: 90  Temp: 98.3 F (36.8 C)  Resp: 18   Filed Weights   04/28/15 1515  Weight: 135 lb 4.8 oz (61.372 kg)    GENERAL:alert, no distress and comfortable SKIN: skin color, texture, turgor are normal, no rashes or significant lesions EYES: normal, conjunctiva are pink and non-injected, sclera clear OROPHARYNX:no exudate, no erythema and lips, buccal mucosa, and tongue normal  NECK: supple, thyroid normal size, non-tender, without nodularity LYMPH:  no palpable lymphadenopathy in the cervical, axillary or inguinal LUNGS: clear to auscultation and percussion with normal breathing effort HEART: regular rate & rhythm and no murmurs  and no lower extremity edema ABDOMEN:abdomen soft, non-tender and normal bowel sounds Musculoskeletal:no cyanosis of digits and no clubbing  PSYCH: alert & oriented x 3 with fluent speech NEURO: no focal motor/sensory deficits Breasts: Breast inspection showed them to be symmetrical with no nipple discharge. Surgical scar in the right breast has healed well, there is diffuse skin pigmentation in the center of right breast and mild redness, still tender, no other palpable mass in both breasts and axilla.  LABORATORY DATA:  I have reviewed the data as listed Lab Results  Component Value Date   WBC 5.2 01/25/2015   HGB 12.4 01/25/2015   HCT 36.6 01/25/2015   MCV 90.0 01/25/2015   PLT 182 01/25/2015    Recent Labs  01/25/15 0845  NA 136  K 3.8  CO2 24  GLUCOSE 140  BUN 8.4  CREATININE 0.7  CALCIUM 9.3  PROT 7.1  ALBUMIN 4.1  AST 29  ALT 35  ALKPHOS 54    BILITOT 0.51    PATHOLOGY REPORT  Diagnosis 02/10/2015 1. Breast, lumpectomy, right - DUCTAL CARCINOMA IN SITU WITH CALCIFICATIONS, INTERMEDIATE GRADE. - DUCTAL CARCINOMA IN SITU COMES TO WITHIN <0.1 CM OF THE ANTERIOR MARGIN FOCALLY. - BIOPSY SITE CHANGE. - FIBROCYSTIC CHANGE AND SCLEROSING ADENOSIS. - SEE ONCOLOGY TABLE. 2. Breast, excision, right further lateral margin - BENIGN BREAST TISSUE WITH USUAL DUCTAL HYPERPLASIA. - NO CARCINOMA IDENTIFIED. 3. Breast, excision, right further inferior margin - BENIGN BREAST TISSUE WITH FIBROCYSTIC CHANGE. - NO CARCINOMA IDENTIFIED. 4. Breast, excision, right shave of lateral margin - BENIGN BREAST TISSUE. - NO CARCINOMA IDENTIFIED.  Microscopic Comment 1. BREAST, IN SITU CARCINOMA Specimen, including laterality: Right breast lump and additional margins. Procedure (include lymph node sampling sentinel-non-sentinel): Right breast lumpectomy and additional margin resection. Grade of carcinoma: II Necrosis: Single cell. Estimated tumor size: (block estimate): 0.6 cm, see comment. Treatment effect: N/A. Distance to closest margin: <0.1 cm from anterior margin focally. If margin positive, focally or broadly: See above. Breast prognostic profile: [id number], see below. Will not be repeated. Estrogen receptor: Positive, 100%. Progesterone receptor: Positive, 30%. Lymph nodes: Not sampled. TNM: pTis, pNX Comments: There is DCIS within 2 blocks and thus the size is estimated at 0.6 cm (0.3 cm per block). Immunohistochemistry was performed on one block and shows preserved basal markers (p63, smooth muscle myosin, and calponin).  RADIOGRAPHIC STUDIES: I have personally reviewed the radiological images as listed and agreed with the findings in the report. No results found.  ASSESSMENT & PLAN:  70 year-old postmenopausal woman, presented with screening discovered DCIS.  1. Right breast DCIS, intermediate grade, ER/PR strongly positive -I  discussed her breast lumpectomy surgical pathology results with patient and her husband in great detail. --Her DCIS will be cured by complete surgical resection. The surgical margins were negative, cleared by her surgeon. Any form of adjuvant therapy is preventive. - I used [facility] DCIS normagram to calculate her risk of  Second breast cancer,  Which showed 10 year risk of 12%  Without any adjuvant therapy, it dropped to  5% with adjuvant irradiation, in the 2% with additional endocrine therapy. -I discussed the role of adjuvant antiestrogen therapy with tamoxifen or anastrozole, which decrease her risk of future breast cancer by ~60%,  However the absolute benefit is only 3% , in this area no survival benefit he did given her multiple comorbidities, I do not  Strongly recommend adjuvant endocrine therapy. - After our lengthy discussion, she decided not to take  Antiestrogen therapy. -  We discussed breast cancer surveillance, Including annual mammogram, breast exam every 6-12 months. - I encouraged her to have a healthy diet and be physically active   2. 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 The patient knows to call the clinic with any problems, questions or concerns. I spent 20 minutes counseling the patient face to face. The total time spent in the appointment was 25 minutes and more than 50% was on counseling.     Truitt Merle, MD 04/28/2015

## 2015-05-02 NOTE — Progress Notes (Signed)
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   NARRATIVE: Gail Manning underwent complex simulation and treatment planning for her boost treatment today.  Her tumor volume was outlined on the planning CT scan. The depth of her cavity was felt to be appropriate for treatment with electrons    12  MeV electrons will be prescribed to the 100%  isodose line.   I personally oversaw and approved the construction of a unique block which will be used for beam modification purposes.  An isodose plan is requested.

## 2015-05-05 ENCOUNTER — Other Ambulatory Visit: Payer: Self-pay | Admitting: Adult Health

## 2015-05-09 NOTE — Progress Notes (Signed)
  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 Right breast/ 42.72 Gy at 2.67 Gy per fraction x 16 fractions.  2. Right breast boost/ 10 Gy at 2 Gy per fraction x 5 fractions  Beams/energy:  1. 3D-Conformal / 10X 2. Electron Monte Carlo / 12 MeV  Narrative: The patient tolerated radiation treatment relatively well.  She experienced slight skin darkening with no moist desquamation.    Plan: The patient has completed radiation treatment. The patient will return to radiation oncology clinic for routine followup in one month. I advised them to call or return sooner if they have any questions or concerns related to their recovery or treatment.  ------------------------------------------------  Thea Silversmith, MD  This document serves as a record of services personally performed by Thea Silversmith, MD. It was created on her behalf by Arlyce Harman, a trained medical scribe. The creation of this record is based on the scribe's personal observations and the provider's statements to them. This document has been checked and approved by the attending provider.

## 2015-06-02 NOTE — Progress Notes (Signed)
[name] is here for a one month for follow up visit for breast cancer of central portion of right female breast  Skin status:Breast feels hot at times.  Color slight redness and tender. Lotion being used: Lotion with vitamin E Have you seen your medical oncologist? Date If not ,when is appointment 10-27-15 When is yiur next mammogram? Has it been scheduled ? :  ER+,have started AI or Tamoxifen? If not, why?  Has not started was discussed  04-28-15 not started Discuss survivorship appointment: 07-09-15 [facility] Offer referral reading material for Survivorship, [employer] and [facility] given  04-25-15 Appetite:Good Pain:None Arm mobility:Able to raise right arm without difficulty. Fatigue:Having fatigue most of the day. BP 142/69 mmHg  Pulse 87  Temp(Src) 98 F (36.7 C) (Oral)  Resp 16  Ht 5\' 2"  (1.575 m)  Wt 138 lb 6.4 oz (62.778 kg)  BMI 25.31 kg/m2  SpO2 97%

## 2015-06-08 ENCOUNTER — Ambulatory Visit
Admission: RE | Admit: 2015-06-08 | Discharge: 2015-06-08 | Disposition: A | Discharge status: Home / Self Care | Payer: Self-pay | Source: Ambulatory Visit | Attending: Radiation Oncology | Admitting: Radiation Oncology

## 2015-06-08 ENCOUNTER — Encounter: Payer: Self-pay | Admitting: Radiation Oncology

## 2015-06-08 VITALS — BP 142/69 | HR 87 | Temp 98.0°F | Resp 16 | Ht 62.0 in | Wt 138.4 lb

## 2015-06-08 DIAGNOSIS — C50111 Malignant neoplasm of central portion of right female breast: Secondary | ICD-10-CM | POA: Insufficient documentation

## 2015-06-08 DIAGNOSIS — Y842 Radiological procedure and radiotherapy as the cause of abnormal reaction of the patient, or of later complication, without mention of misadventure at the time of the procedure: Secondary | ICD-10-CM | POA: Insufficient documentation

## 2015-06-08 NOTE — Progress Notes (Signed)
   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 Right breast/ 42.72 Gy at 2.67 Gy per fraction x 16 fractions.  2. Right breast boost/ 10 Gy at 2 Gy per fraction x 5 fractions  Interval History: [name] is here for a one month for follow up visit for breast cancer of central portion of right female breast. She reports that her breast feels hot at times with slightness redness and tenderness. The tenderness is worse when she lies down and better when she sits. She stopped taking the neurontin which was helping with the pain but making her sleepy. She continues to use lotion with Vitamin E. Her appetite is good, she denies pain and is able to raise her right arm without difficulty. She reports fatigue for most of the day. She no longer needs pain medication. Her survivorship appointment is scheduled for 07/09/15.   Physical Exam:  Filed Vitals:   06/08/15 1411  BP: 142/69  Pulse: 87  Temp: 98 F (36.7 C)  TempSrc: Oral  Resp: 16  Height: 5\' 2"  (1.575 m)  Weight: 138 lb 6.4 oz (62.778 kg)  SpO2: 97%    Surgical scar in the right breast has healed well, with mild hyperpigmentation, some tenderness. There is also minimal edema but this seems to be out of proportion to her expressed tenderness.    IMPRESSION: Gail Manning is a 70 y.o. female diagnosed with right breast cancer. The patient is recovering from the effects of radiation.    PLAN: She would prefer to receive  information on survivorship through mail, I will go ahead and cancel her survivorship appointment. We discussed PT to help with her right breast discomfort which she declined at this time. .  She has an appointment with Dr. Burr Medico in October. She will continue follow up with Dr.Hoxworth. Follow up in RadOnc PRN. She knows to contact me if she has any further questions.    We discussed the need for follow up every 4-6 months which she has scheduled.  We discussed the need for yearly mammograms which she can schedule with her OBGYN or with medical oncology. We discussed the need for sun protection in the treated area.  She can always call me with questions.  I will follow up with her on an as needed basis.    ------------------------------------------------  Thea Silversmith, MD  This document serves as a record of services personally performed by Thea Silversmith, MD. It was created on her behalf by Derek Mound, a trained medical scribe. The creation of this record is based on the scribe's personal observations and the provider's statements to them. This document has been checked and approved by the attending provider.

## 2015-06-13 ENCOUNTER — Encounter: Payer: Self-pay | Admitting: Nurse Practitioner

## 2015-06-13 DIAGNOSIS — C50111 Malignant neoplasm of central portion of right female breast: Secondary | ICD-10-CM

## 2015-06-13 NOTE — Progress Notes (Signed)
The Survivorship Care Plan was mailed to Gail Manning as she reported not being able to come in to the Survivorship Clinic for an in-person visit at this time. A letter was mailed to her outlining the purpose of the content of the care plan, as well as encouraging her to reach out to me with any questions or concerns.  My business card was included in the correspondence to the patient as well.  A copy of the care plan was also routed/faxed/mailed to [facility], MD, the patient's PCP.  I will not be placing any follow-up appointments to the Survivorship Clinic for Gail Manning, but I am happy to see her at any time in the future for any survivorship concerns that may arise. Thank you for allowing me to participate in her care!  Kenn File, [place] [phone]

## 2015-07-28 ENCOUNTER — Encounter: Payer: Self-pay | Admitting: Nurse Practitioner

## 2015-10-27 ENCOUNTER — Ambulatory Visit (HOSPITAL_BASED_OUTPATIENT_CLINIC_OR_DEPARTMENT_OTHER): Payer: PRIVATE HEALTH INSURANCE | Admitting: Hematology

## 2015-10-27 ENCOUNTER — Encounter: Payer: Self-pay | Admitting: Hematology

## 2015-10-27 VITALS — BP 117/79 | HR 94 | Temp 99.2°F | Resp 18 | Ht 62.0 in | Wt 140.9 lb

## 2015-10-27 DIAGNOSIS — I10 Essential (primary) hypertension: Secondary | ICD-10-CM

## 2015-10-27 DIAGNOSIS — E119 Type 2 diabetes mellitus without complications: Secondary | ICD-10-CM

## 2015-10-27 DIAGNOSIS — Z170 Estrogen receptor positive status [ER+]: Secondary | ICD-10-CM

## 2015-10-27 DIAGNOSIS — C50111 Malignant neoplasm of central portion of right female breast: Secondary | ICD-10-CM

## 2015-10-27 DIAGNOSIS — D0511 Intraductal carcinoma in situ of right breast: Secondary | ICD-10-CM | POA: Diagnosis not present

## 2015-10-27 DIAGNOSIS — H3322 Serous retinal detachment, left eye: Secondary | ICD-10-CM | POA: Diagnosis not present

## 2015-10-27 MED ORDER — NYSTATIN 100000 UNIT/GM EX POWD: Freq: Three times a day (TID) | CUTANEOUS | 1 refills | Status: DC

## 2015-10-27 NOTE — Progress Notes (Signed)
[facility]  Telephone:(336) [phone] Fax:(336) [phone]  Clinic follow up Note   Patient Care Team: Alyssa Grove, MD as PCP - General Sylvan Cheese, NP as Nurse Practitioner (Hematology and Oncology) Truitt Merle, MD as Consulting Physician (Hematology) Excell Seltzer, MD as Consulting Physician (General Surgery) Thea Silversmith, MD as Consulting Physician (Radiation Oncology) 10/27/2015  CHIEF COMPLAINTS:  Follow Up right breast DCIS   Oncology History   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, right   Staging form: Breast, AJCC 7th Edition     Clinical stage from 01/16/2015: Stage 0 (Tis (DCIS), N0, M0) - Signed by Truitt Merle, MD on 01/25/2015     Pathologic stage from 02/10/2015: Stage 0 (Tis (DCIS), N0, cM0) - Signed by Truitt Merle, MD on 04/28/2015        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, right   01/2015 Mammogram    small area of calcification in right breast       01/16/2015 Initial Biopsy    Right breast core needle bx: DCIS ER+ (100%), PR+ (30%)      01/16/2015 Clinical Stage    Stage 0: Tis N0      02/10/2015 Surgery     right breast lumpectomy      02/10/2015 Pathology Results     right breast lumpectomy showed DCIS with calcification, intermediate grade,  anterior margin focally less than 0.1 cm, re-excision margins were negative       02/10/2015 Pathologic Stage    Stage 0: Tis No      03/29/2015 - 04/26/2015 Radiation Therapy     adjuvant breast irradiation       Anti-estrogen oral therapy    Declined       Survivorship    Care plan (in [place] and [place]) mailed to patient in lieu of in person visit       HISTORY OF PRESENTING ILLNESS:  Gail Manning 70 y.o. Spanish-speaking female is here because of her recently diagnosed right breast cancer. She is accompanied her husband and interpreter Gregary Signs to our multidisciplinary breast clinic today.  This was discovered by screening mammogram. She had no palpable  breast mass, no skin or nipple change. She denies any pain, or other new symptoms. She has good appetite and energy level, no recent weight loss. She recently had some left hearing issue, will see a ENT. 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 She lives with her husband, does not exercise regularly, but is physically active.    GYN HISTORY  Menarchal: 17 LMP: 53 Contraceptive: No       HRT: 4-5 months  G6P3: (+) breast feeding   CURRENT THERAPY: Observation  INTERIM HISTORY:  Gail Manning returns for followup. She is accompanied by her friend and interpreter Gregary Signs to my clinic today. She is doing well overall, she complains of right breast swelling and intermittent pain. Especially when she sleeps on her right side, and her breast is tender. She otherwise feels well, denies any other new pain, or other symptoms. 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   MEDICAL HISTORY:  Past Medical History:  Diagnosis Date  . Breast cancer ([place])   . 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 Diabetes mellitus without complication ([place])   . Hyperlipidemia   . Hypertension     SURGICAL HISTORY: Past Surgical History:  Procedure Laterality Date  . BREAST  LUMPECTOMY WITH NEEDLE LOCALIZATION Right 02/10/2015   Procedure: RIGHT BREAST LUMPECTOMY WITH NEEDLE LOCALIZATION;  Surgeon: Excell Seltzer, MD;  Location: [facility];  Service: General;  Laterality: Right;  . CESAREAN SECTION     x3    SOCIAL HISTORY: Social History   Social History  . Marital status: Married    Spouse name: N/A  . Number of children: N/A  . Years of education: N/A   Occupational History  . Not on file.   Social History Main Topics  . Smoking status: Never Smoker  . Smokeless tobacco: Never Used  . Alcohol use No  . Drug use: No  . Sexual activity: Not on file   Other Topics Concern  . Not on file   Social  History Narrative  . No narrative on file    FAMILY HISTORY: Family History  Problem Relation Age of Onset  . Diabetes Mother   . Hypertension Mother   . Rheum arthritis Mother   . Cancer Father     colon  . Diabetes Father   . Hypertension Father   . Breast cancer Sister   . Hypertension Sister     ALLERGIES:  is allergic to albuterol sulfate.  MEDICATIONS:  Current Outpatient Prescriptions  Medication Sig Dispense Refill  . calcium & magnesium carbonates (MYLANTA) [id number] MG tablet Take 3 tablets by mouth daily.    Marland Kitchen lisinopril (PRINIVIL,ZESTRIL) 20 MG tablet Take 20 mg by mouth daily.    Marland Kitchen lovastatin (MEVACOR) 20 MG tablet Take 20 mg by mouth at bedtime.    . metFORMIN (GLUCOPHAGE) 1000 MG tablet Take 1,000 mg by mouth 2 (two) times daily with a meal. Reported on 01/25/2015     No current facility-administered medications for this visit.     REVIEW OF SYSTEMS:   Constitutional: Denies fevers, chills or abnormal night sweats Eyes: Denies blurriness of vision, double vision or watery eyes Ears, nose, mouth, throat, and face: Denies mucositis or sore throat Respiratory: Denies cough, dyspnea or wheezes Cardiovascular: Denies palpitation, chest discomfort or lower extremity swelling Gastrointestinal:  Denies nausea, heartburn or change in bowel habits Skin: Denies abnormal skin rashes Lymphatics: Denies new lymphadenopathy or easy bruising Neurological:Denies numbness, tingling or new weaknesses Behavioral/Psych: Mood is stable, no new changes  All other systems were reviewed with the patient and are negative.  PHYSICAL EXAMINATION: ECOG PERFORMANCE STATUS: 1 - Symptomatic but completely ambulatory  Vitals:   10/27/15 1520  BP: 117/79  Pulse: 94  Resp: 18  Temp: 99.2 F (37.3 C)   Filed Weights   10/27/15 1520  Weight: 140 lb 14.4 oz (63.9 kg)    GENERAL:alert, no distress and comfortable SKIN: skin color, texture, turgor are normal, no rashes or significant  lesions EYES: normal, conjunctiva are pink and non-injected, sclera clear OROPHARYNX:no exudate, no erythema and lips, buccal mucosa, and tongue normal  NECK: supple, thyroid normal size, non-tender, without nodularity LYMPH:  no palpable lymphadenopathy in the cervical, axillary or inguinal LUNGS: clear to auscultation and percussion with normal breathing effort HEART: regular rate & rhythm and no murmurs and no lower extremity edema ABDOMEN:abdomen soft, non-tender and normal bowel sounds Musculoskeletal:no cyanosis of digits and no clubbing  PSYCH: alert & oriented x 3 with fluent speech NEURO: no focal motor/sensory deficits Breasts: Breast inspection showed them to be symmetrical with no nipple discharge. Surgical scar in the right breast has healed well, there is diffuse skin pigmentation in the center of right breast and mild lymphocytic  edema around the nipple and inferior right breast, tender, no other palpable mass in both breasts and axilla.  LABORATORY DATA:  I have reviewed the data as listed CBC Latest Ref Rng & Units 01/25/2015  WBC 3.9 - 10.3 10e3/uL 5.2  Hemoglobin 11.6 - 15.9 g/dL 12.4  Hematocrit 34.8 - 46.6 % 36.6  Platelets 145 - 400 10e3/uL 182   CMP Latest Ref Rng & Units 01/25/2015  Glucose 70 - 140 mg/dl 140  BUN 7.0 - 26.0 mg/dL 8.4  Creatinine 0.6 - 1.1 mg/dL 0.7  Sodium 136 - 145 mEq/L 136  Potassium 3.5 - 5.1 mEq/L 3.8  CO2 22 - 29 mEq/L 24  Calcium 8.4 - 10.4 mg/dL 9.3  Total Protein 6.4 - 8.3 g/dL 7.1  Total Bilirubin 0.20 - 1.20 mg/dL 0.51  Alkaline Phos 40 - 150 U/L 54  AST 5 - 34 U/L 29  ALT 0 - 55 U/L 35     PATHOLOGY REPORT  Diagnosis 02/10/2015 1. Breast, lumpectomy, right - DUCTAL CARCINOMA IN SITU WITH CALCIFICATIONS, INTERMEDIATE GRADE. - DUCTAL CARCINOMA IN SITU COMES TO WITHIN <0.1 CM OF THE ANTERIOR MARGIN FOCALLY. - BIOPSY SITE CHANGE. - FIBROCYSTIC CHANGE AND SCLEROSING ADENOSIS. - SEE ONCOLOGY TABLE. 2. Breast, excision, right  further lateral margin - BENIGN BREAST TISSUE WITH USUAL DUCTAL HYPERPLASIA. - NO CARCINOMA IDENTIFIED. 3. Breast, excision, right further inferior margin - BENIGN BREAST TISSUE WITH FIBROCYSTIC CHANGE. - NO CARCINOMA IDENTIFIED. 4. Breast, excision, right shave of lateral margin - BENIGN BREAST TISSUE. - NO CARCINOMA IDENTIFIED.  Microscopic Comment 1. BREAST, IN SITU CARCINOMA Specimen, including laterality: Right breast lump and additional margins. Procedure (include lymph node sampling sentinel-non-sentinel): Right breast lumpectomy and additional margin resection. Grade of carcinoma: II Necrosis: Single cell. Estimated tumor size: (block estimate): 0.6 cm, see comment. Treatment effect: N/A. Distance to closest margin: <0.1 cm from anterior margin focally. If margin positive, focally or broadly: See above. Breast prognostic profile: [id number], see below. Will not be repeated. Estrogen receptor: Positive, 100%. Progesterone receptor: Positive, 30%. Lymph nodes: Not sampled. TNM: pTis, pNX Comments: There is DCIS within 2 blocks and thus the size is estimated at 0.6 cm (0.3 cm per block). Immunohistochemistry was performed on one block and shows preserved basal markers (p63, smooth muscle myosin, and calponin).  RADIOGRAPHIC STUDIES: I have personally reviewed the radiological images as listed and agreed with the findings in the report. No results found.  ASSESSMENT & PLAN:  70 year-old postmenopausal woman, presented with screening discovered DCIS.  1. Right breast DCIS, intermediate grade, ER/PR strongly positive -I discussed her breast lumpectomy surgical pathology results with patient and her husband in great detail. --Her DCIS will be cured by complete surgical resection. The surgical margins were negative, cleared by her surgeon. Any form of adjuvant therapy is preventive. - I used [facility] DCIS normagram to calculate her risk of  Second breast cancer,  Which  showed 10 year risk of 12%  Without any adjuvant therapy, it dropped to  5% with adjuvant irradiation, in the 2% with additional endocrine therapy. -I discussed the role of adjuvant antiestrogen therapy with tamoxifen or anastrozole, which decrease her risk of future breast cancer by ~60%,  However the absolute benefit is only 3% , in this area no survival benefit he did given her multiple comorbidities, I do not  Strongly recommend adjuvant endocrine therapy. - After our lengthy discussion, she decided not to take  Antiestrogen therapy. -We discussed breast cancer surveillance, Including annual mammogram, breast  exam every 6-12 months. -She is clinically doing well, exam was unremarkable except mild right breast lymphedema, no concern for recurrence. -She is due for screening mammogram in January 2018 - I encouraged her to have a healthy diet and be physically active   2. Right breast pain and lymphedema -I recommend her to try physical therapy for her lymphedema, which may improve her pain. -She can take Tylenol or Motrin as needed for pain  3. 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 The patient knows to call the clinic with any problems, questions or concerns. I spent 20 minutes counseling the patient face to face. The total time spent in the appointment was 25 minutes and more than 50% was on counseling.     Truitt Merle, MD 10/27/2015

## 2015-10-27 NOTE — Progress Notes (Signed)
[facility]  Telephone:(336) [phone] Fax:(336) [phone]  Clinic follow up Note   Patient Care Team: Alyssa Grove, MD as PCP - General Sylvan Cheese, NP as Nurse Practitioner (Hematology and Oncology) Truitt Merle, MD as Consulting Physician (Hematology) Excell Seltzer, MD as Consulting Physician (General Surgery) Thea Silversmith, MD as Consulting Physician (Radiation Oncology) 10/27/2015  CHIEF COMPLAINTS:  Follow Up right breast DCIS   Oncology History   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, right   Staging form: Breast, AJCC 7th Edition     Clinical stage from 01/16/2015: Stage 0 (Tis (DCIS), N0, M0) - Signed by Truitt Merle, MD on 01/25/2015     Pathologic stage from 02/10/2015: Stage 0 (Tis (DCIS), N0, cM0) - Signed by Truitt Merle, MD on 04/28/2015        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, right   01/2015 Mammogram    small area of calcification in right breast       01/16/2015 Initial Biopsy    Right breast core needle bx: DCIS ER+ (100%), PR+ (30%)      01/16/2015 Clinical Stage    Stage 0: Tis N0      02/10/2015 Surgery     right breast lumpectomy      02/10/2015 Pathology Results     right breast lumpectomy showed DCIS with calcification, intermediate grade,  anterior margin focally less than 0.1 cm, re-excision margins were negative       02/10/2015 Pathologic Stage    Stage 0: Tis No      03/29/2015 - 04/26/2015 Radiation Therapy     adjuvant breast irradiation       Anti-estrogen oral therapy    Declined       Survivorship    Care plan (in [place] and [place]) mailed to patient in lieu of in person visit       HISTORY OF PRESENTING ILLNESS:  Gail Manning 70 y.o. Spanish-speaking female is here because of her recently diagnosed right breast cancer. She is accompanied her husband and interpreter Gregary Signs to our multidisciplinary breast clinic today.  This was discovered by screening mammogram. She had no palpable  breast mass, no skin or nipple change. She denies any pain, or other new symptoms. She has good appetite and energy level, no recent weight loss. She recently had some left hearing issue, will see a ENT. 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 She lives with her husband, does not exercise regularly, but is physically active.    GYN HISTORY  Menarchal: 17 LMP: 53 Contraceptive: No       HRT: 4-5 months  G6P3: (+) breast feeding   CURRENT THERAPY: recovering from radiation   INTERIM HISTORY:  She returns for followup. She presents to the clinic with her daughter-in-law and interpreter Gregary Signs. She has completed radiation 2 days ago, tolerated well overall, she did have moderate radiation dermatitis, and the right breast is still sensitive. 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 She has very limited vision, lives with hr husband, able to function (take car of herself) at home. No other new complains.    MEDICAL HISTORY:  Past Medical History:  Diagnosis Date  . Breast cancer ([place])   . 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 Diabetes mellitus without complication ([facility])   . Hyperlipidemia   . Hypertension     SURGICAL HISTORY: Past Surgical History:  Procedure Laterality Date  . BREAST LUMPECTOMY WITH NEEDLE LOCALIZATION Right 02/10/2015   Procedure: RIGHT BREAST LUMPECTOMY WITH NEEDLE LOCALIZATION;  Surgeon: Excell Seltzer, MD;  Location: [facility];  Service: General;  Laterality: Right;  . CESAREAN SECTION     x3    SOCIAL HISTORY: Social History   Social History  . Marital status: Married    Spouse name: N/A  . Number of children: N/A  . Years of education: N/A   Occupational History  . Not on file.   Social History Main Topics  . Smoking status: Never Smoker  . Smokeless tobacco: Never Used  . Alcohol use No  . Drug use: No  . Sexual activity: Not on  file   Other Topics Concern  . Not on file   Social History Narrative  . No narrative on file    FAMILY HISTORY: Family History  Problem Relation Age of Onset  . Diabetes Mother   . Hypertension Mother   . Rheum arthritis Mother   . Cancer Father     colon  . Diabetes Father   . Hypertension Father   . Breast cancer Sister   . Hypertension Sister     ALLERGIES:  is allergic to albuterol sulfate.  MEDICATIONS:  Current Outpatient Prescriptions  Medication Sig Dispense Refill  . aspirin EC 81 MG tablet Take 81 mg by mouth daily.    . calcium & magnesium carbonates (MYLANTA) [id number] MG tablet Take 3 tablets by mouth daily.    . Calcium Carb-Cholecalciferol (CALCIUM 1000 + D PO) Take 2 tablets by mouth daily.    . Hypromellose (SYSTANE OVERNIGHT THERAPY OP) Apply 1 drop to eye 3 (three) times daily. 1 drop in each eye TID.    Marland Kitchen lisinopril (PRINIVIL,ZESTRIL) 20 MG tablet Take 20 mg by mouth daily.    Marland Kitchen lovastatin (MEVACOR) 20 MG tablet Take 20 mg by mouth at bedtime.    . metFORMIN (GLUCOPHAGE) 1000 MG tablet Take 1,000 mg by mouth 2 (two) times daily with a meal. Reported on 01/25/2015    . vitamin E 1000 UNIT capsule Take 1,000 Units by mouth daily.    Marland Kitchen VITAMIN E SKIN OIL Apply topically daily.     No current facility-administered medications for this visit.     REVIEW OF SYSTEMS:   Constitutional: Denies fevers, chills or abnormal night sweats Eyes: Denies blurriness of vision, double vision or watery eyes Ears, nose, mouth, throat, and face: Denies mucositis or sore throat Respiratory: Denies cough, dyspnea or wheezes Cardiovascular: Denies palpitation, chest discomfort or lower extremity swelling Gastrointestinal:  Denies nausea, heartburn or change in bowel habits Skin: Denies abnormal skin rashes Lymphatics: Denies new lymphadenopathy or easy bruising Neurological:Denies numbness, tingling or new weaknesses Behavioral/Psych: Mood is stable, no new changes  All  other systems were reviewed with the patient and are negative.  PHYSICAL EXAMINATION: ECOG PERFORMANCE STATUS: 1 - Symptomatic but completely ambulatory  Vitals:   10/27/15 1520  BP: 117/79  Pulse: 94  Resp: 18  Temp: 99.2 F (37.3 C)   Filed Weights   10/27/15 1520  Weight: 140 lb 14.4 oz (63.9 kg)    GENERAL:alert, no distress and comfortable SKIN: skin color, texture, turgor are normal, no rashes or significant lesions EYES: normal, conjunctiva are pink and non-injected, sclera clear OROPHARYNX:no exudate, no erythema and lips, buccal mucosa, and tongue normal  NECK: supple, thyroid normal size, non-tender, without nodularity LYMPH:  no palpable lymphadenopathy in the cervical, axillary or  inguinal LUNGS: clear to auscultation and percussion with normal breathing effort HEART: regular rate & rhythm and no murmurs and no lower extremity edema ABDOMEN:abdomen soft, non-tender and normal bowel sounds Musculoskeletal:no cyanosis of digits and no clubbing  PSYCH: alert & oriented x 3 with fluent speech NEURO: no focal motor/sensory deficits Breasts: Breast inspection showed them to be symmetrical with no nipple discharge. Surgical scar in the right breast has healed well, there is diffuse skin pigmentation in the center of right breast and mild redness, still tender, no other palpable mass in both breasts and axilla.  LABORATORY DATA:  I have reviewed the data as listed Lab Results  Component Value Date   WBC 5.2 01/25/2015   HGB 12.4 01/25/2015   HCT 36.6 01/25/2015   MCV 90.0 01/25/2015   PLT 182 01/25/2015    Recent Labs  01/25/15 0845  NA 136  K 3.8  CO2 24  GLUCOSE 140  BUN 8.4  CREATININE 0.7  CALCIUM 9.3  PROT 7.1  ALBUMIN 4.1  AST 29  ALT 35  ALKPHOS 54  BILITOT 0.51    PATHOLOGY REPORT  Diagnosis 02/10/2015 1. Breast, lumpectomy, right - DUCTAL CARCINOMA IN SITU WITH CALCIFICATIONS, INTERMEDIATE GRADE. - DUCTAL CARCINOMA IN SITU COMES TO WITHIN  <0.1 CM OF THE ANTERIOR MARGIN FOCALLY. - BIOPSY SITE CHANGE. - FIBROCYSTIC CHANGE AND SCLEROSING ADENOSIS. - SEE ONCOLOGY TABLE. 2. Breast, excision, right further lateral margin - BENIGN BREAST TISSUE WITH USUAL DUCTAL HYPERPLASIA. - NO CARCINOMA IDENTIFIED. 3. Breast, excision, right further inferior margin - BENIGN BREAST TISSUE WITH FIBROCYSTIC CHANGE. - NO CARCINOMA IDENTIFIED. 4. Breast, excision, right shave of lateral margin - BENIGN BREAST TISSUE. - NO CARCINOMA IDENTIFIED.  Microscopic Comment 1. BREAST, IN SITU CARCINOMA Specimen, including laterality: Right breast lump and additional margins. Procedure (include lymph node sampling sentinel-non-sentinel): Right breast lumpectomy and additional margin resection. Grade of carcinoma: II Necrosis: Single cell. Estimated tumor size: (block estimate): 0.6 cm, see comment. Treatment effect: N/A. Distance to closest margin: <0.1 cm from anterior margin focally. If margin positive, focally or broadly: See above. Breast prognostic profile: [id number], see below. Will not be repeated. Estrogen receptor: Positive, 100%. Progesterone receptor: Positive, 30%. Lymph nodes: Not sampled. TNM: pTis, pNX Comments: There is DCIS within 2 blocks and thus the size is estimated at 0.6 cm (0.3 cm per block). Immunohistochemistry was performed on one block and shows preserved basal markers (p63, smooth muscle myosin, and calponin).  RADIOGRAPHIC STUDIES: I have personally reviewed the radiological images as listed and agreed with the findings in the report. No results found.  ASSESSMENT & PLAN:  70 year-old postmenopausal woman, presented with screening discovered DCIS.  1. Right breast DCIS, intermediate grade, ER/PR strongly positive -I discussed her breast lumpectomy surgical pathology results with patient and her husband in great detail. --Her DCIS will be cured by complete surgical resection. The surgical margins were negative,  cleared by her surgeon. Any form of adjuvant therapy is preventive. - I used [facility] DCIS normagram to calculate her risk of  Second breast cancer,  Which showed 10 year risk of 12%  Without any adjuvant therapy, it dropped to  5% with adjuvant irradiation, in the 2% with additional endocrine therapy. -I discussed the role of adjuvant antiestrogen therapy with tamoxifen or anastrozole, which decrease her risk of future breast cancer by ~60%,  However the absolute benefit is only 3% , in this area no survival benefit he did given her multiple comorbidities, I do not  Strongly recommend adjuvant endocrine therapy. - After our lengthy discussion, she decided not to take  Antiestrogen therapy. -We discussed breast cancer surveillance, Including annual mammogram, breast exam every 6-12 months. - I encouraged her to have a healthy diet and be physically active   2. 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 The patient knows to call the clinic with any problems, questions or concerns. I spent 20 minutes counseling the patient face to face. The total time spent in the appointment was 25 minutes and more than 50% was on counseling.     Truitt Merle, MD 10/27/2015

## 2015-10-28 ENCOUNTER — Encounter: Payer: Self-pay | Admitting: Hematology

## 2015-11-06 ENCOUNTER — Ambulatory Visit: Payer: PRIVATE HEALTH INSURANCE | Attending: Hematology | Admitting: Physical Therapy

## 2015-11-06 ENCOUNTER — Encounter: Payer: Self-pay | Admitting: Physical Therapy

## 2015-11-06 DIAGNOSIS — M25512 Pain in left shoulder: Secondary | ICD-10-CM | POA: Diagnosis present

## 2015-11-06 DIAGNOSIS — M25511 Pain in right shoulder: Secondary | ICD-10-CM | POA: Insufficient documentation

## 2015-11-06 DIAGNOSIS — G8929 Other chronic pain: Secondary | ICD-10-CM | POA: Diagnosis present

## 2015-11-06 DIAGNOSIS — M25612 Stiffness of left shoulder, not elsewhere classified: Secondary | ICD-10-CM | POA: Diagnosis present

## 2015-11-06 DIAGNOSIS — M25611 Stiffness of right shoulder, not elsewhere classified: Secondary | ICD-10-CM | POA: Insufficient documentation

## 2015-11-06 NOTE — Therapy (Signed)
[place], [place], [address] Phone: [phone]   Fax:  [phone]  Physical Therapy Evaluation  Patient Details  Name: Gail Manning MRN: [id number] Date of Birth: [birth year] Referring Provider: Burr Medico  Encounter Date: 11/06/2015      PT End of Session - 11/06/15 1654    Visit Number 1   Number of Visits 9   Date for PT Re-Evaluation 12/04/15   PT Start Time 1606   PT Stop Time 1654   PT Time Calculation (min) 48 min   Activity Tolerance Patient tolerated treatment well   Behavior During Therapy [facility] for tasks assessed/performed      Past Medical History:  Diagnosis Date  . Breast cancer ([place])   . 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 Diabetes mellitus without complication ([place])   . Hyperlipidemia   . Hypertension     Past Surgical History:  Procedure Laterality Date  . BREAST LUMPECTOMY WITH NEEDLE LOCALIZATION Right 02/10/2015   Procedure: RIGHT BREAST LUMPECTOMY WITH NEEDLE LOCALIZATION;  Surgeon: Excell Seltzer, MD;  Location: [place];  Service: General;  Laterality: Right;  . CESAREAN SECTION     x3    There were no vitals filed for this visit.       Subjective Assessment - 11/06/15 1610    Subjective Oncology told me it was swelling. I do feel pain. It's a mild pain. I told the oncologist that when I am sleeping on my back I feel something on my side. I was told I should lay on my left side and not my right side. I have pain when I stretch my arm up or when I am in the shower. It burns and it hurts.    Patient Stated Goals I would like for the pain and swelling to go away   Currently in Pain? No/denies   Pain Score 0-No pain            [facility] PT Assessment - 11/06/15 0001      Assessment   Medical Diagnosis right breast cancer   Referring Provider Feng   Onset Date/Surgical Date 02/10/15   Hand Dominance Right   Prior  Therapy none     Precautions   Precautions Other (comment)  lymphedema, cancer     Restrictions   Weight Bearing Restrictions No     Balance Screen   Has the patient fallen in the past 6 months No   Has the patient had a decrease in activity level because of a fear of falling?  No   Is the patient reluctant to leave their home because of a fear of falling?  No     Home Ecologist residence   Living Arrangements Spouse/significant other   Available Help at Discharge Family   Type of [place] to enter   Entrance Stairs-Number of Steps [place] One level   [place] - standard     Prior Function   Level of [place]  does everything but cook, husband cooks, pt has poor vision   Vocation Unemployed   Leisure pt has been unable to exercise since surgery but prior was using elliptical, stepper, treadmill     Cognition   Overall Cognitive Status Within Functional Limits for tasks assessed     AROM   Right Shoulder Flexion  151 Degrees   Right Shoulder ABduction 116 Degrees   Right Shoulder Internal Rotation 48 Degrees   Right Shoulder External Rotation 70 Degrees   Left Shoulder Flexion 167 Degrees   Left Shoulder ABduction 158 Degrees   Left Shoulder Internal Rotation --  not tested secondary to pain   Left Shoulder External Rotation --  not tested secondary to pain           LYMPHEDEMA/ONCOLOGY QUESTIONNAIRE - 11/06/15 1625      Type   Cancer Type right breast cancer     Surgeries   Lumpectomy Date 02/10/15     Treatment   Active Chemotherapy Treatment No   Past Chemotherapy Treatment No   Active Radiation Treatment No   Past Radiation Treatment Yes   Current Hormone Treatment No   Past Hormone Therapy No     What other symptoms do you have   Are you Having Heaviness or Tightness Yes   Are you having Pain Yes   Do you have  infections No   Is there Decreased scar mobility Yes     Lymphedema Assessments   Lymphedema Assessments --           Katina Dung - 11/06/15 0001    Open a tight or new jar Mild difficulty   Do heavy household chores (wash walls, wash floors) Moderate difficulty   Carry a shopping bag or briefcase No difficulty   Wash your back No difficulty   Use a knife to cut food No difficulty   Recreational activities in which you take some force or impact through your arm, shoulder, or hand (golf, hammering, tennis) Unable   During the past week, to what extent has your arm, shoulder or hand problem interfered with your normal social activities with family, friends, neighbors, or groups? Slightly   During the past week, to what extent has your arm, shoulder or hand problem limited your work or other regular daily activities Slightly   Arm, shoulder, or hand pain. Moderate   Tingling (pins and needles) in your arm, shoulder, or hand Mild   Difficulty Sleeping Moderate difficulty   DASH Score 31.82 %                             Long Term Clinic Goals - 11/06/15 1700      CC Long Term Goal  #1   Title Patient to report a 70% improvement in right breast pain to allow improved comfort.    Time 4   Period Weeks   Status New     CC Long Term Goal  #2   Title Patient to demonstrate 165 degrees of right shoulder flexion to allow her to reach items overhead   Baseline 151   Time 4   Period Weeks   Status New     CC Long Term Goal  #3   Title Patient to demonstrate 155 degrees of right shoulder abduction to allow her to reach items out to sides.    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 She underwent a lumpectomy and radiation therapy. Patient presents to PT with pain in right breast especially around her nipple. She has pain with bilateral shoulder ROM and her right is more limited than her left. She states she had a shot in one of her shoulders but can not remember which one. She has tightness in right axilla with upper extremity movement. She would benefit from skilled PT services for strengthening and stretching of bilateral UEs, decrease pain and sensitivity of R breast and nipple and decrease tightness in right axilla and pecs.    Rehab Potential Good   Clinical Impairments Affecting Rehab Potential hx of radiation   PT Frequency 2x / week   PT Duration 4 weeks   PT Treatment/Interventions ADLs/Self Care Home Management;Iontophoresis 4mg /ml Dexamethasone;Therapeutic exercise;Patient/family education;Passive range of motion;Scar mobilization;Manual techniques;Manual lymph drainage;Taping   PT Next Visit Plan instruct in scar desensitization, AAROM/AROM/PROM of bilateral shoulders, give supine dowel as part of HEP    Consulted and Agree with Plan of Care Patient;Family member/caregiver      Patient will benefit from skilled therapeutic intervention in order to improve the following deficits and impairments:  Increased fascial restricitons, Pain, Decreased scar mobility, Impaired UE functional use, Decreased strength, Decreased range of motion  Visit Diagnosis: Stiffness of right shoulder, not elsewhere classified - Plan: PT plan of care cert/re-cert  Acute pain of right shoulder - Plan: PT plan of care cert/re-cert  Stiffness of left shoulder, not elsewhere classified - Plan: PT plan of care cert/re-cert  Chronic left shoulder pain - Plan: PT plan of care  cert/re-cert     Problem List Patient Active Problem List   Diagnosis Date Noted  . Malignant neoplasm of central portion of right female breast ([place]) 01/20/2015  . FIBROIDS, UTERUS 10/30/2005  . DIABETES MELLITUS, TYPE II 10/30/2005  . HYPERLIPIDEMIA 10/30/2005  . PERIPHERAL NEUROPATHY 10/30/2005  . RETINITIS PIGMENTOSA 10/30/2005  . CATARACTS 10/30/2005  . BLINDNESS, BILATERAL 10/30/2005  . HYPERTENSION 10/30/2005  . ALLERGIC RHINITIS 10/30/2005  . GERD 10/30/2005  . FATTY LIVER DISEASE 10/30/2005  . VITILIGO 10/30/2005  . FROZEN RIGHT SHOULDER 10/30/2005  . HEADACHE 10/30/2005  . LIVER FUNCTION TESTS, ABNORMAL 10/30/2005    Alexia Freestone 11/06/2015, 5:12 PM  [place], [place], [address] Phone: [phone]   Fax:  [phone]  Name: Gail Manning MRN: [id number] Date of Birth: [birth year]  Allyson Sabal, PT 11/06/15 5:12 PM

## 2015-11-07 ENCOUNTER — Ambulatory Visit: Payer: PRIVATE HEALTH INSURANCE | Admitting: Physical Therapy

## 2015-11-07 ENCOUNTER — Encounter: Payer: Self-pay | Admitting: Physical Therapy

## 2015-11-07 DIAGNOSIS — G8929 Other chronic pain: Secondary | ICD-10-CM

## 2015-11-07 DIAGNOSIS — M25612 Stiffness of left shoulder, not elsewhere classified: Secondary | ICD-10-CM

## 2015-11-07 DIAGNOSIS — M25512 Pain in left shoulder: Secondary | ICD-10-CM

## 2015-11-07 DIAGNOSIS — M25611 Stiffness of right shoulder, not elsewhere classified: Secondary | ICD-10-CM | POA: Diagnosis not present

## 2015-11-07 DIAGNOSIS — M25511 Pain in right shoulder: Secondary | ICD-10-CM

## 2015-11-07 NOTE — Therapy (Signed)
[place], [place], [address] Phone: [phone]   Fax:  [phone]  Physical Therapy Treatment  Patient Details  Name: Gail Manning MRN: [id number] Date of Birth: [birth year] Referring Provider: Burr Medico  Encounter Date: 11/07/2015      PT End of Session - 11/07/15 1250    Visit Number 2   Number of Visits 9   Date for PT Re-Evaluation 12/04/15   PT Start Time [id number]   PT Stop Time 1105   PT Time Calculation (min) 50 min   Activity Tolerance Patient tolerated treatment well   Behavior During Therapy [facility] for tasks assessed/performed      Past Medical History:  Diagnosis Date  . Breast cancer ([place])   . 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 Diabetes mellitus without complication ([place])   . Hyperlipidemia   . Hypertension     Past Surgical History:  Procedure Laterality Date  . BREAST LUMPECTOMY WITH NEEDLE LOCALIZATION Right 02/10/2015   Procedure: RIGHT BREAST LUMPECTOMY WITH NEEDLE LOCALIZATION;  Surgeon: Excell Seltzer, MD;  Location: [place];  Service: General;  Laterality: Right;  . CESAREAN SECTION     x3    There were no vitals filed for this visit.      Subjective Assessment - 11/07/15 1018    Subjective I am not having any pain today. Yesterday evening when I left here I had pain.    Pertinent History right breast lumpectomy showed DCIS with calcification, intermediate grade,  anterior margin focally less than 0.1 cm, re-excision margins were negative , pt underwent right lumpectomy and has completed radiation therapy   Patient Stated Goals I would like for the pain and swelling to go away   Currently in Pain? No/denies   Pain Score 0-No pain               LYMPHEDEMA/ONCOLOGY QUESTIONNAIRE - 11/06/15 1625      Type   Cancer Type right breast cancer     Surgeries   Lumpectomy Date 02/10/15     Treatment   Active  Chemotherapy Treatment No   Past Chemotherapy Treatment No   Active Radiation Treatment No   Past Radiation Treatment Yes   Current Hormone Treatment No   Past Hormone Therapy No     What other symptoms do you have   Are you Having Heaviness or Tightness Yes   Are you having Pain Yes   Do you have infections No   Is there Decreased scar mobility Yes     Lymphedema Assessments   Lymphedema Assessments --                  [facility] Adult PT Treatment/Exercise - 11/07/15 0001      Shoulder Exercises: Supine   Flexion AAROM;10 reps;Other (comment)  dowel - hold 10 sec    ABduction AAROM;Right;5 reps;Other (comment)  with max verbal cues for correct form   Other Supine Exercises instructed patient in desensitization techniques starting with silk, cotton and progressing to more coarse fabric like washcloth     Shoulder Exercises: Standing   Other Standing Exercises corner stretch with 10 sec holds x 3     Shoulder Exercises: Pulleys   Flexion 2 minutes   Flexion Limitations --  cues to keep shoulders relaxed   ABduction 2 minutes     Shoulder Exercises: Therapy Ball   Flexion 10 reps     Manual Therapy  Manual Therapy Passive ROM   Passive ROM to R shoulder in direction of flexion, abduction, ER to pt's tolerance, pt had pain with abduction                PT Education - 11/07/15 1250    Education provided Yes   Education Details desensitization techniques   Person(s) Educated Patient;Other (comment)   Methods Explanation;Demonstration   Comprehension Verbalized understanding                Long Term Clinic Goals - 11/06/15 1700      CC Long Term Goal  #1   Title Patient to report a 70% improvement in right breast pain to allow improved comfort.    Time 4   Period Weeks   Status New     CC Long Term Goal  #2   Title Patient to demonstrate 165 degrees of right shoulder flexion to allow her to reach items overhead   Baseline 151   Time 4    Period Weeks   Status New     CC Long Term Goal  #3   Title Patient to demonstrate 155 degrees of right shoulder abduction to allow her to reach items out to sides.    Baseline 116   Time 4   Period Weeks   Status New     CC Long Term Goal  #4   Title Patient to be independent in a home exercise program for continued strengthening and stretching   Time 4   Period Weeks   Status New     CC Long Term Goal  #5   Title Patient to be independent in desensitization protocol for management of right nipple and breast pain   Time 4   Period Weeks   Status New            Plan - 11/07/15 1255    Clinical Impression Statement Instructed patient in AAROM/AROM/PROM today. She did very well with the new exercises and felt stretching in her axilla. Instructed patient in desensitization technique for right nipple where she has increased sensitivity and pain. Patient had shoulder pain with PROM to R shoulder into abduction. She states 12-13 years ago she had a shot in her shoulder and it hasn't been hurting since then.   Rehab Potential Good   Clinical Impairments Affecting Rehab Potential hx of radiation   PT Frequency 2x / week   PT Duration 4 weeks   PT Treatment/Interventions ADLs/Self Care Home Management;Iontophoresis 4mg /ml Dexamethasone;Therapeutic exercise;Patient/family education;Passive range of motion;Scar mobilization;Manual techniques;Manual lymph drainage;Taping   PT Next Visit Plan assess for indep with scar desensitization and HEP, begin scar mobilization to nipple area, AAROM/AROM/PROM of bilateral shoulders,    PT Home Exercise Plan supine dowel, corner stretch, desensitization to R nipple, ball up wall   Consulted and Agree with Plan of Care Patient;Family member/caregiver      Patient will benefit from skilled therapeutic intervention in order to improve the following deficits and impairments:  Increased fascial restricitons, Pain, Decreased scar mobility, Impaired UE  functional use, Decreased strength, Decreased range of motion  Visit Diagnosis: Stiffness of right shoulder, not elsewhere classified  Acute pain of right shoulder  Stiffness of left shoulder, not elsewhere classified  Chronic left shoulder pain     Problem List Patient Active Problem List   Diagnosis Date Noted  . Malignant neoplasm of central portion of right female breast ([place]) 01/20/2015  . FIBROIDS, UTERUS 10/30/2005  . DIABETES MELLITUS, TYPE  II 10/30/2005  . HYPERLIPIDEMIA 10/30/2005  . PERIPHERAL NEUROPATHY 10/30/2005  . RETINITIS PIGMENTOSA 10/30/2005  . CATARACTS 10/30/2005  . BLINDNESS, BILATERAL 10/30/2005  . HYPERTENSION 10/30/2005  . ALLERGIC RHINITIS 10/30/2005  . GERD 10/30/2005  . FATTY LIVER DISEASE 10/30/2005  . VITILIGO 10/30/2005  . FROZEN RIGHT SHOULDER 10/30/2005  . HEADACHE 10/30/2005  . LIVER FUNCTION TESTS, ABNORMAL 10/30/2005    Alexia Freestone 11/07/2015, 1:09 PM  [place], [place], [address] Phone: [phone]   Fax:  [phone]  Name: Gail Manning MRN: [id number] Date of Birth: 26-Dec-1945  Allyson Sabal, PT 11/07/15 1:10 PM

## 2015-11-07 NOTE — Patient Instructions (Signed)
Shoulder: Flexion (Supine)    With hands shoulder width apart, slowly lower dowel to floor behind head. Do not let elbows bend. Keep back flat. Hold _15___ seconds. Repeat _10___ times. Do __2__ sessions per day. CAUTION: Stretch slowly and gently.  Copyright  VHI. All rights reserved.  Shoulder: Abduction (Supine)    With right arm flat on floor, hold dowel in palm. Slowly move arm up to side of head by pushing with opposite arm. Do not let elbow bend. Hold _15___ seconds. Repeat _10___ times. Do _2___ sessions per day. CAUTION: Stretch slowly and gently.  Copyright  VHI. All rights reserved.   

## 2015-11-14 ENCOUNTER — Ambulatory Visit: Payer: PRIVATE HEALTH INSURANCE | Attending: Hematology | Admitting: Physical Therapy

## 2015-11-14 DIAGNOSIS — M25612 Stiffness of left shoulder, not elsewhere classified: Secondary | ICD-10-CM | POA: Insufficient documentation

## 2015-11-14 DIAGNOSIS — M25511 Pain in right shoulder: Secondary | ICD-10-CM | POA: Diagnosis present

## 2015-11-14 DIAGNOSIS — M25512 Pain in left shoulder: Secondary | ICD-10-CM | POA: Insufficient documentation

## 2015-11-14 DIAGNOSIS — M25611 Stiffness of right shoulder, not elsewhere classified: Secondary | ICD-10-CM | POA: Insufficient documentation

## 2015-11-14 DIAGNOSIS — G8929 Other chronic pain: Secondary | ICD-10-CM | POA: Diagnosis present

## 2015-11-14 NOTE — Therapy (Signed)
[place], [place], [address] Phone: [phone]   Fax:  [phone]  Physical Therapy Treatment  Patient Details  Name: Gail Manning MRN: [id number] Date of Birth: [birth year] Referring Provider: Burr Medico  Encounter Date: 11/14/2015      PT End of Session - 11/14/15 1713    Visit Number 3   Number of Visits 9   Date for PT Re-Evaluation 12/04/15   PT Start Time 1518   PT Stop Time 1601   PT Time Calculation (min) 43 min   Activity Tolerance Patient tolerated treatment well;Patient limited by pain   Behavior During Therapy [facility] for tasks assessed/performed      Past Medical History:  Diagnosis Date  . Breast cancer ([place])   . 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 Diabetes mellitus without complication ([place])   . Hyperlipidemia   . Hypertension     Past Surgical History:  Procedure Laterality Date  . BREAST LUMPECTOMY WITH NEEDLE LOCALIZATION Right 02/10/2015   Procedure: RIGHT BREAST LUMPECTOMY WITH NEEDLE LOCALIZATION;  Surgeon: Excell Seltzer, MD;  Location: [place];  Service: General;  Laterality: Right;  . CESAREAN SECTION     x3    There were no vitals filed for this visit.      Subjective Assessment - 11/14/15 1523    Subjective Tender because of the exercises.  Wants to get pulleys for at home, so husband came with her today to see that set-up.  The other she does with her cane.  Used a breastfeeding pad for desensitization of the nipple; did feel like it helped a little.  Used almond oil on the dark area, and that is getting softer.     Patient is accompained by: Family member;Interpreter  husband and interpreter   Currently in Pain? Yes   Pain Score 4    Pain Location Other (Comment)  "everywhere"   Pain Descriptors / Indicators Tender   Aggravating Factors  every time I move   Pain Relieving Factors the exercise                          [facility] Adult PT Treatment/Exercise - 11/14/15 0001      Shoulder Exercises: Supine   Flexion AAROM;Both  hold 10 counts, 3 reps   ABduction AAROM;Right;5 reps  10 count holds     Shoulder Exercises: Seated   Other Seated Exercises shoulder rolls at end of session     Shoulder Exercises: Standing   Other Standing Exercises corner stretch with 10 sec holds x 3     Shoulder Exercises: Pulleys   Flexion 2 minutes   ABduction 2 minutes     Shoulder Exercises: Therapy Ball   Flexion 10 reps     Manual Therapy   Manual Therapy Myofascial release   Myofascial Release crosshands technique to right chest in diagonal, vertical, and horizontal directions; patient had difficulty tolerating right arm in abduction with concurrent stretch in diagonal at lower left abdomen                PT Education - 11/14/15 1713    Education provided Yes   Education Details cane exercises in supine for shoulder flexion and abduction   Person(s) Educated Patient;Spouse   Methods Explanation;Tactile cues;Handout   Comprehension Verbalized understanding;Returned demonstration                [facility]  Goals - 11/06/15 1700      CC Long Term Goal  #1   Title Patient to report a 70% improvement in right breast pain to allow improved comfort.    Time 4   Period Weeks   Status New     CC Long Term Goal  #2   Title Patient to demonstrate 165 degrees of right shoulder flexion to allow her to reach items overhead   Baseline 151   Time 4   Period Weeks   Status New     CC Long Term Goal  #3   Title Patient to demonstrate 155 degrees of right shoulder abduction to allow her to reach items out to sides.    Baseline 116   Time 4   Period Weeks   Status New     CC Long Term Goal  #4   Title Patient to be independent in a home exercise program for continued strengthening and stretching   Time 4   Period Weeks   Status New     CC Long  Term Goal  #5   Title Patient to be independent in desensitization protocol for management of right nipple and breast pain   Time 4   Period Weeks   Status New            Plan - 11/14/15 1714    Clinical Impression Statement Patient did well today, but needed review of HEP for shoulder AA/ROM.  Had some discomfort with myofascial work at right chest, so position needed to be adjusted.   Rehab Potential Good   Clinical Impairments Affecting Rehab Potential hx of radiation   PT Frequency 2x / week   PT Duration 4 weeks   PT Treatment/Interventions ADLs/Self Care Home Management;Iontophoresis 4mg /ml Dexamethasone;Therapeutic exercise;Patient/family education;Passive range of motion;Scar mobilization;Manual techniques;Manual lymph drainage;Taping   PT Next Visit Plan assess goals; continue myofascial work at right shoulder and chest areas; continue ROM   PT Home Exercise Plan supine dowel, corner stretch, desensitization to R nipple, ball up wall   Consulted and Agree with Plan of Care Patient;Family member/caregiver      Patient will benefit from skilled therapeutic intervention in order to improve the following deficits and impairments:  Increased fascial restricitons, Pain, Decreased scar mobility, Impaired UE functional use, Decreased strength, Decreased range of motion  Visit Diagnosis: Stiffness of right shoulder, not elsewhere classified  Acute pain of right shoulder     Problem List Patient Active Problem List   Diagnosis Date Noted  . Malignant neoplasm of central portion of right female breast ([place]) 01/20/2015  . FIBROIDS, UTERUS 10/30/2005  . DIABETES MELLITUS, TYPE II 10/30/2005  . HYPERLIPIDEMIA 10/30/2005  . PERIPHERAL NEUROPATHY 10/30/2005  . RETINITIS PIGMENTOSA 10/30/2005  . CATARACTS 10/30/2005  . BLINDNESS, BILATERAL 10/30/2005  . HYPERTENSION 10/30/2005  . ALLERGIC RHINITIS 10/30/2005  . GERD 10/30/2005  . FATTY LIVER DISEASE 10/30/2005  . VITILIGO  10/30/2005  . FROZEN RIGHT SHOULDER 10/30/2005  . HEADACHE 10/30/2005  . LIVER FUNCTION TESTS, ABNORMAL 10/30/2005    SALISBURY,DONNA 11/14/2015, 5:17 PM  [place], [place], [address] Phone: [phone]   Fax:  [phone]  Name: Gail Manning MRN: [id number] Date of Birth: 02-28-1945  Serafina Royals, PT 11/14/15 5:18 PM

## 2015-11-14 NOTE — Patient Instructions (Signed)
Inferior Capsule Stick Stretch I    Lie down, stand or sit, dowel in palm of arm to be stretched. Other arm, holding dowel in front of body, pushes outward and upward until arm being stretched is as high to the side as possible. Hold __10_ seconds. Repeat __5-10_ times per session. Do _2__ sessions per day.  Copyright  VHI. All rights reserved.  Flexors Stick Stretch, Supine    Lie on back, stick in both hands above chest. Extend both arms over head as far as possible. Hold _10__ seconds. Repeat _5-10__ times per session. Do _2__ sessions per day.  Copyright  VHI. All rights reserved.

## 2015-11-15 ENCOUNTER — Ambulatory Visit: Payer: PRIVATE HEALTH INSURANCE | Admitting: Physical Therapy

## 2015-11-15 DIAGNOSIS — G8929 Other chronic pain: Secondary | ICD-10-CM

## 2015-11-15 DIAGNOSIS — M25611 Stiffness of right shoulder, not elsewhere classified: Secondary | ICD-10-CM | POA: Diagnosis not present

## 2015-11-15 DIAGNOSIS — M25511 Pain in right shoulder: Secondary | ICD-10-CM

## 2015-11-15 DIAGNOSIS — M25512 Pain in left shoulder: Secondary | ICD-10-CM

## 2015-11-15 DIAGNOSIS — M25612 Stiffness of left shoulder, not elsewhere classified: Secondary | ICD-10-CM

## 2015-11-15 NOTE — Therapy (Signed)
[place], [place], [address] Phone: [phone]   Fax:  [phone]  Physical Therapy Treatment  Patient Details  Name: Gail Manning MRN: [id number] Date of Birth: 1945-02-12 Referring Provider: Burr Medico  Encounter Date: 11/15/2015      PT End of Session - 11/15/15 1151    Visit Number 4   Number of Visits 9   Date for PT Re-Evaluation 12/04/15   PT Start Time 1100   PT Stop Time 1140   PT Time Calculation (min) 40 min   Activity Tolerance Patient tolerated treatment well   Behavior During Therapy [facility] for tasks assessed/performed      Past Medical History:  Diagnosis Date  . Breast cancer ([place])   . 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 Diabetes mellitus without complication ([place])   . Hyperlipidemia   . Hypertension     Past Surgical History:  Procedure Laterality Date  . BREAST LUMPECTOMY WITH NEEDLE LOCALIZATION Right 02/10/2015   Procedure: RIGHT BREAST LUMPECTOMY WITH NEEDLE LOCALIZATION;  Surgeon: Excell Seltzer, MD;  Location: [place];  Service: General;  Laterality: Right;  . CESAREAN SECTION     x3    There were no vitals filed for this visit.      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 No/denies            [facility] PT Assessment - 11/15/15 0001      AROM   Right Shoulder Flexion 172 Degrees   Right Shoulder ABduction 165 Degrees                      [facility] Adult PT Treatment/Exercise - 11/15/15 0001      Shoulder Exercises: Supine   Other Supine Exercises dowel rod exercise in flexion and abduction      Shoulder Exercises: Sidelying   ABduction PROM;Right;5 reps  with stretch overhead    Other Sidelying Exercises small circles with hand pointed to ceiling 5 times in each direction    Other Sidelying Exercises stretch right arm overhead with left hand on ribs to achieve a counter stretch      Shoulder Exercises: Standing   Other Standing Exercises wall washing with folded towel in flexion, abduction and "windshield wiper'     Shoulder Exercises: Pulleys   Flexion 2 minutes   ABduction 2 minutes     Shoulder Exercises: Therapy Ball   Flexion 5 reps                PT Education - 11/14/15 1713    Education provided Yes   Education Details cane exercises in supine for shoulder flexion and abduction   Person(s) Educated Patient;Spouse   Methods Explanation;Tactile cues;Handout   Comprehension Verbalized understanding;Returned demonstration                [facility] Goals - 11/15/15 1113      CC Long Term Goal  #1  Title Patient to report a 70% improvement in right breast pain to allow improved comfort.    Status Achieved     CC Long Term Goal  #2   Title Patient to demonstrate 165 degrees of right shoulder flexion to allow her to reach items overhead   Status Achieved     CC Long Term Goal  #3   Title Patient to demonstrate 155 degrees of right shoulder abduction to allow her to reach items out to sides.    Status Achieved     CC Long Term Goal  #4   Title Patient to be independent in a home exercise program for continued strengthening and stretching   Status Achieved     CC Long Term Goal  #5   Title Patient to be independent in desensitization protocol for management of right nipple and breast pain   Status Achieved            Plan - 11/15/15 1152     Clinical Impression Statement Pt has been doing exercises at home and is able to do repeat demonstration of them today.  She feels that she will be able to continue to do them at home without difficuty. She has accomplished all her PT goals and feels that she is ready to discharge from this episode    Rehab Potential Good   Clinical Impairments Affecting Rehab Potential hx of radiation   PT Next Visit Plan Discharge    Consulted and Agree with Plan of Care Patient;Family member/caregiver      Patient will benefit from skilled therapeutic intervention in order to improve the following deficits and impairments:     Visit Diagnosis: Stiffness of right shoulder, not elsewhere classified  Acute pain of right shoulder  Stiffness of left shoulder, not elsewhere classified  Chronic left shoulder pain     Problem List Patient Active Problem List   Diagnosis Date Noted  . 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 FIBROIDS, UTERUS 10/30/2005  . DIABETES MELLITUS, TYPE II 10/30/2005  . HYPERLIPIDEMIA 10/30/2005  . PERIPHERAL NEUROPATHY 10/30/2005  . RETINITIS PIGMENTOSA 10/30/2005  . CATARACTS 10/30/2005  . BLINDNESS, BILATERAL 10/30/2005  . HYPERTENSION 10/30/2005  . ALLERGIC RHINITIS 10/30/2005  . GERD 10/30/2005  . FATTY LIVER DISEASE 10/30/2005  . VITILIGO 10/30/2005  . FROZEN RIGHT SHOULDER 10/30/2005  . HEADACHE 10/30/2005  . LIVER FUNCTION TESTS, ABNORMAL 10/30/2005   PHYSICAL THERAPY DISCHARGE SUMMARY  Visits from Start of Care: 4  Current functional level related to goals / functional outcomes: Pt is independent in her home program and knows how to manage her symptoms    Remaining deficits: Occasional pain at distal portion of right lateral breast    Education / Equipment: Home exercise  Plan: Patient agrees to discharge.  Patient goals were met. Patient is being discharged due to meeting the stated rehab goals.  ?????      Donato Heinz. Owens Shark PT  Norwood Levo 11/15/2015, 11:55 AM  [place], [place], [address] Phone: [phone]   Fax:  [phone]  Name: Gail Manning MRN: [id number] Date of Birth: [birth year]

## 2015-11-19 ENCOUNTER — Encounter: Payer: Self-pay | Admitting: Hematology

## 2015-11-20 ENCOUNTER — Ambulatory Visit: Payer: PRIVATE HEALTH INSURANCE | Admitting: Physical Therapy

## 2015-11-21 ENCOUNTER — Ambulatory Visit: Payer: PRIVATE HEALTH INSURANCE | Admitting: Physical Therapy

## 2015-11-22 ENCOUNTER — Telehealth: Payer: Self-pay | Admitting: Hematology

## 2015-11-22 NOTE — Telephone Encounter (Signed)
Spoke with patient via [employer] [phone] re appointment for 04/26/16 and contacting [facility] re mammo for January.

## 2015-11-23 ENCOUNTER — Encounter: Payer: Self-pay | Admitting: *Deleted

## 2015-11-23 NOTE — Progress Notes (Signed)
[place] Directives Clinical Social Work  Clinical Social Work was referred by Lavon Paganini interpreter  to review and complete healthcare advance directives.  Clinical Social Worker met with patient, interpreter and daughter in law in [place] office.  The patient designated Refugio Cortes-Gonzalez as their primary healthcare agent and Luis Cortes-Padilla as their secondary agent.  Patient declined to complete healthcare living will.    Clinical Social Worker notarized documents and made copies for patient/family. Clinical Social Worker will send documents to medical records to be scanned into patient's chart. Clinical Social Worker encouraged patient/family to contact with any additional questions or concerns.  Loren Racer, [place] Worker [place]  [place] Phone: [phone] Fax: [phone]

## 2015-11-27 ENCOUNTER — Encounter: Payer: PRIVATE HEALTH INSURANCE | Admitting: Physical Therapy

## 2015-11-28 ENCOUNTER — Encounter: Payer: PRIVATE HEALTH INSURANCE | Admitting: Physical Therapy

## 2015-12-05 ENCOUNTER — Encounter: Payer: PRIVATE HEALTH INSURANCE | Admitting: Physical Therapy

## 2016-03-29 ENCOUNTER — Ambulatory Visit
Admission: RE | Admit: 2016-03-29 | Discharge: 2016-03-29 | Disposition: A | Discharge status: Home / Self Care | Payer: PRIVATE HEALTH INSURANCE | Source: Ambulatory Visit | Attending: Hematology | Admitting: Hematology

## 2016-03-29 DIAGNOSIS — Z170 Estrogen receptor positive status [ER+]: Principal | ICD-10-CM

## 2016-03-29 DIAGNOSIS — C50111 Malignant neoplasm of central portion of right female breast: Secondary | ICD-10-CM

## 2016-03-29 HISTORY — DX: Personal history of irradiation: Z92.3

## 2016-04-13 ENCOUNTER — Telehealth: Payer: Self-pay | Admitting: Hematology

## 2016-04-13 NOTE — Telephone Encounter (Signed)
Scheduled appt per sch message from [facility]. - Sent letter with appt date and time out.

## 2016-04-22 NOTE — Progress Notes (Signed)
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, right   Staging form: Breast, AJCC 7th Edition     Clinical stage from 01/16/2015: Stage 0 (Tis (DCIS), N0, M0) - Signed by Truitt Merle, MD on 01/25/2015     Pathologic stage from 02/10/2015: Stage 0 (Tis (DCIS), N0, cM0) - Signed by Truitt Merle, MD on 04/28/2015        Ductal carcinoma in situ (DCIS) of right breast   01/2015 Mammogram    small area of calcification in right breast       01/16/2015 Initial Biopsy    Right breast core needle bx: DCIS ER+ (100%), PR+ (30%)      01/16/2015 Clinical Stage    Stage 0: Tis N0      02/10/2015 Surgery     right breast lumpectomy      02/10/2015 Pathology Results     right breast lumpectomy showed DCIS with calcification, intermediate grade,  anterior margin focally less than 0.1 cm, re-excision margins were negative       02/10/2015 Pathologic Stage    Stage 0: Tis No      03/29/2015 - 04/26/2015 Radiation Therapy     adjuvant breast irradiation       Anti-estrogen oral therapy    Declined       Survivorship    Care plan (in [place] and [place]) mailed to patient in lieu of in person visit       HISTORY OF PRESENTING ILLNESS:  Gail Manning 71 y.o. Spanish-speaking female is here because of her recently diagnosed right breast cancer. She is accompanied her husband and interpreter Gregary Signs to our multidisciplinary breast clinic today.  This was discovered by screening mammogram. She had no  palpable breast mass, no skin or nipple change. She denies any pain, or other new symptoms. She has good appetite and energy level, no recent weight loss. She recently had some left hearing issue, will see a ENT. 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 She lives with her husband, does not exercise regularly, but is physically active.   GYN HISTORY  Menarchal: 17 LMP: 53 Contraceptive: No       HRT: 4-5 months  G6P3: (+) breast feeding   CURRENT THERAPY: Observation  INTERIM HISTORY:  Gail Manning returns for followup. She is accompanied by her husband and a Dentist who facilitated our visit today. The patient has been doing well. She attended physical therapy since our last visit and it has helped. She denies swelling. She continues to do the physical therapy at home. She reports very little pain in her breast at the nipple and it is getting less and less. She admits she is getting better. She does report urinary frequency and urgency for about one year. She has talked with her PCP about this. Patient states her last blood glucose was 76 and her last A1c was 6.4. She has been exercising and eating less carbs.    MEDICAL HISTORY:  Past Medical History:  Diagnosis Date  . Breast cancer ([place])   .  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 Diabetes mellitus without complication ([place])   . Hyperlipidemia   . Hypertension   . Personal history of radiation therapy     SURGICAL HISTORY: Past Surgical History:  Procedure Laterality Date  . BREAST LUMPECTOMY    . BREAST LUMPECTOMY WITH NEEDLE LOCALIZATION Right 02/10/2015   Procedure: RIGHT BREAST LUMPECTOMY WITH NEEDLE LOCALIZATION;  Surgeon: Excell Seltzer, MD;  Location: [place];  Service: General;  Laterality: Right;  . CESAREAN SECTION     x3    SOCIAL HISTORY: Social History   Social History  . Marital status: Married    Spouse name: N/A  . Number of  children: N/A  . Years of education: N/A   Occupational History  . Not on file.   Social History Main Topics  . Smoking status: Never Smoker  . Smokeless tobacco: Never Used  . Alcohol use No  . Drug use: No  . Sexual activity: Not on file   Other Topics Concern  . Not on file   Social History Narrative  . No narrative on file    FAMILY HISTORY: Family History  Problem Relation Age of Onset  . Diabetes Mother   . Hypertension Mother   . Rheum arthritis Mother   . Cancer Father     colon  . Diabetes Father   . Hypertension Father   . Breast cancer Sister   . Hypertension Sister     ALLERGIES:  is allergic to albuterol sulfate.  MEDICATIONS:  Current Outpatient Prescriptions  Medication Sig Dispense Refill  . aspirin EC 81 MG tablet Take 81 mg by mouth daily.    . calcium & magnesium carbonates (MYLANTA) 099-833 MG tablet Take 3 tablets by mouth daily.    . Calcium Carb-Cholecalciferol (CALCIUM 1000 + D PO) Take 2 tablets by mouth daily.    . Hypromellose (SYSTANE OVERNIGHT THERAPY OP) Apply 1 drop to eye 3 (three) times daily. 1 drop in each eye TID.    Marland Kitchen lisinopril (PRINIVIL,ZESTRIL) 20 MG tablet Take 20 mg by mouth daily.    Marland Kitchen lovastatin (MEVACOR) 20 MG tablet Take 20 mg by mouth at bedtime.    . metFORMIN (GLUCOPHAGE) 1000 MG tablet Take 1,000 mg by mouth 2 (two) times daily with a meal. Reported on 01/25/2015    . nystatin (MYCOSTATIN/NYSTOP) powder Apply topically 3 (three) times daily. 15 g 1  . vitamin E 1000 UNIT capsule Take 1,000 Units by mouth daily.    Marland Kitchen VITAMIN E SKIN OIL Apply topically daily.     No current facility-administered medications for this visit.     REVIEW OF SYSTEMS:   Constitutional: Denies fevers, chills or abnormal night sweats Eyes: Denies blurriness of vision, double vision or watery eyes Ears, nose, mouth, throat, and face: Denies mucositis or sore throat Respiratory: Denies cough, dyspnea or wheezes Cardiovascular: Denies  palpitation, chest discomfort or lower extremity swelling Gastrointestinal:  Denies nausea, heartburn or change in bowel habits Genitourinary: (+) urinary urgency and frequency Skin: Denies abnormal skin rashes Lymphatics: Denies new lymphadenopathy or easy bruising Neurological:Denies numbness, tingling or new weaknesses Behavioral/Psych: Mood is stable, no new changes  All other systems were reviewed with the patient and are negative.  PHYSICAL EXAMINATION: ECOG PERFORMANCE STATUS: 1 - Symptomatic but completely ambulatory  Vitals:   04/26/16 1301  BP: 135/78  Pulse: 67  Resp: 18  Temp: 98 F (36.7 C)   Filed Weights   04/26/16  1301  Weight: 128 lb 9.6 oz (58.3 kg)    GENERAL:alert, no distress and comfortable SKIN: skin color, texture, turgor are normal, no rashes or significant lesions EYES: normal, conjunctiva are pink and non-injected, sclera clear OROPHARYNX:no exudate, no erythema and lips, buccal mucosa, and tongue normal  NECK: supple, thyroid normal size, non-tender, without nodularity LYMPH:  no palpable lymphadenopathy in the cervical, axillary or inguinal LUNGS: clear to auscultation and percussion with normal breathing effort HEART: regular rate & rhythm and no murmurs and no lower extremity edema ABDOMEN:abdomen soft, non-tender and normal bowel sounds Musculoskeletal:no cyanosis of digits and no clubbing  PSYCH: alert & oriented x 3 with fluent speech NEURO: no focal motor/sensory deficits Breasts: Breast inspection showed them to be symmetrical with no nipple discharge. Surgical scar in the right breast has healed well and slight tenderness at surgical incision sight, no other palpable mass in both breasts and axilla.   LABORATORY DATA:  I have reviewed the data as listed  No new lab  PATHOLOGY REPORT  Diagnosis 02/10/2015 1. Breast, lumpectomy, right - DUCTAL CARCINOMA IN SITU WITH CALCIFICATIONS, INTERMEDIATE GRADE. - DUCTAL CARCINOMA IN SITU COMES TO  WITHIN <0.1 CM OF THE ANTERIOR MARGIN FOCALLY. - BIOPSY SITE CHANGE. - FIBROCYSTIC CHANGE AND SCLEROSING ADENOSIS. - SEE ONCOLOGY TABLE. 2. Breast, excision, right further lateral margin - BENIGN BREAST TISSUE WITH USUAL DUCTAL HYPERPLASIA. - NO CARCINOMA IDENTIFIED. 3. Breast, excision, right further inferior margin - BENIGN BREAST TISSUE WITH FIBROCYSTIC CHANGE. - NO CARCINOMA IDENTIFIED. 4. Breast, excision, right shave of lateral margin - BENIGN BREAST TISSUE. - NO CARCINOMA IDENTIFIED.  Microscopic Comment 1. BREAST, IN SITU CARCINOMA Specimen, including laterality: Right breast lump and additional margins. Procedure (include lymph node sampling sentinel-non-sentinel): Right breast lumpectomy and additional margin resection. Grade of carcinoma: II Necrosis: Single cell. Estimated tumor size: (block estimate): 0.6 cm, see comment. Treatment effect: N/A. Distance to closest margin: <0.1 cm from anterior margin focally. If margin positive, focally or broadly: See above. Breast prognostic profile: [id number], see below. Will not be repeated. Estrogen receptor: Positive, 100%. Progesterone receptor: Positive, 30%. Lymph nodes: Not sampled. TNM: pTis, pNX Comments: There is DCIS within 2 blocks and thus the size is estimated at 0.6 cm (0.3 cm per block). Immunohistochemistry was performed on one block and shows preserved basal markers (p63, smooth muscle myosin, and calponin).  RADIOGRAPHIC STUDIES: I have personally reviewed the radiological images as listed and agreed with the findings in the report. Mm Diag Breast Tomo Bilateral  Result Date: 03/29/2016 CLINICAL DATA:  71 year old female for annual bilateral mammograms. History of right breast cancer and lumpectomy in 2017. EXAM: 2D DIGITAL DIAGNOSTIC BILATERAL MAMMOGRAM WITH CAD AND ADJUNCT TOMO COMPARISON:  Previous exam(s). ACR Breast Density Category b: There are scattered areas of fibroglandular density. FINDINGS: 2D and  3D spot full field views of both breasts and a magnification view of the right lumpectomy site are performed. There is no evidence of suspicious mass, nonsurgical distortion or worrisome calcifications. Lumpectomy changes within the right breast are identified. Mammographic images were processed with CAD. IMPRESSION: No mammographic evidence of breast malignancy. RECOMMENDATION: Bilateral diagnostic mammograms in 1 year. I have discussed the findings and recommendations with the patient. Results were also provided in writing at the conclusion of the visit. If applicable, a reminder letter will be sent to the patient regarding the next appointment. BI-RADS CATEGORY  2: Benign. Electronically Signed   By: Margarette Canada M.D.   On: 03/29/2016 15:52  ASSESSMENT & PLAN:  71 y.o. year-old postmenopausal woman, presented with screening discovered DCIS.  1. Right breast DCIS, intermediate grade, ER/PR strongly positive -I previously discussed her breast lumpectomy surgical pathology results with patient and her husband in great detail. --Her DCIS has been cured by complete surgical resection. The surgical margins were negative, cleared by her surgeon. Any form of adjuvant therapy is preventive. -Pt received adjuvant breast radiation  -she decided not to take  Antiestrogen therapy. -We discussed breast cancer surveillance, Including annual mammogram, breast exam every 6-12 months. -She is clinically doing well, exam was unremarkable, no concern for recurrence. -Mammogram on 03/29/16 showed no mammographic evidence of breast malignancy.  - I encouraged her to have a healthy diet and be physically active   2. Right breast pain and lymphedema (improved) -The patient has taken physical therapy and continues to do the exercises at home. -She reports minimal breast pain which is still improving -She can take Tylenol or Motrin as needed for pain  3. 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 The patient knows to call the clinic with any problems, questions or concerns. I spent 15 minutes counseling the patient face to face. The total time spent in the appointment was 20 minutes and more than 50% was on counseling.  This document serves as a record of services personally performed by Truitt Merle, MD. It was created on her behalf by Arlyce Harman, a trained medical scribe. The creation of this record is based on the scribe's personal observations and the provider's statements to them. This document has been checked and approved by the attending provider.    Truitt Merle, MD 04/26/2016

## 2016-04-26 ENCOUNTER — Other Ambulatory Visit: Payer: PRIVATE HEALTH INSURANCE

## 2016-04-26 ENCOUNTER — Encounter: Payer: Self-pay | Admitting: Hematology

## 2016-04-26 ENCOUNTER — Telehealth: Payer: Self-pay | Admitting: Hematology

## 2016-04-26 ENCOUNTER — Ambulatory Visit (HOSPITAL_BASED_OUTPATIENT_CLINIC_OR_DEPARTMENT_OTHER): Payer: PRIVATE HEALTH INSURANCE | Admitting: Hematology

## 2016-04-26 VITALS — BP 135/78 | HR 67 | Temp 98.0°F | Resp 18 | Ht 62.0 in | Wt 128.6 lb

## 2016-04-26 DIAGNOSIS — D0511 Intraductal carcinoma in situ of right breast: Secondary | ICD-10-CM

## 2016-04-26 DIAGNOSIS — N644 Mastodynia: Secondary | ICD-10-CM

## 2016-04-26 DIAGNOSIS — R350 Frequency of micturition: Secondary | ICD-10-CM | POA: Diagnosis not present

## 2016-04-26 DIAGNOSIS — I890 Lymphedema, not elsewhere classified: Secondary | ICD-10-CM | POA: Diagnosis not present

## 2016-04-26 DIAGNOSIS — I10 Essential (primary) hypertension: Secondary | ICD-10-CM

## 2016-04-26 DIAGNOSIS — R3915 Urgency of urination: Secondary | ICD-10-CM | POA: Diagnosis not present

## 2016-04-26 DIAGNOSIS — E119 Type 2 diabetes mellitus without complications: Secondary | ICD-10-CM | POA: Diagnosis not present

## 2016-04-26 NOTE — Telephone Encounter (Signed)
Appointments scheduled per 4.20.18 LOS. Patient given AVS report and calendars with future scheduled appointments. °

## 2016-06-17 ENCOUNTER — Other Ambulatory Visit: Payer: Self-pay

## 2016-06-21 LAB — CYTOLOGY - PAP: DIAGNOSIS: NEGATIVE

## 2016-10-07 ENCOUNTER — Telehealth: Payer: Self-pay | Admitting: Hematology

## 2016-10-07 ENCOUNTER — Other Ambulatory Visit: Payer: Self-pay | Admitting: Hematology

## 2016-10-07 ENCOUNTER — Other Ambulatory Visit (HOSPITAL_COMMUNITY): Payer: Self-pay | Admitting: *Deleted

## 2016-10-07 DIAGNOSIS — N644 Mastodynia: Secondary | ICD-10-CM

## 2016-10-07 DIAGNOSIS — N6310 Unspecified lump in the right breast, unspecified quadrant: Secondary | ICD-10-CM

## 2016-10-07 NOTE — Telephone Encounter (Signed)
Called regarding October appointment that Mr Gail Manning requested

## 2016-10-16 ENCOUNTER — Encounter: Payer: Self-pay | Admitting: Hematology

## 2016-10-17 ENCOUNTER — Encounter (HOSPITAL_COMMUNITY): Payer: Self-pay

## 2016-10-17 ENCOUNTER — Ambulatory Visit: Payer: PRIVATE HEALTH INSURANCE

## 2016-10-17 ENCOUNTER — Ambulatory Visit
Admission: RE | Admit: 2016-10-17 | Discharge: 2016-10-17 | Disposition: A | Discharge status: Home / Self Care | Payer: Self-pay | Source: Ambulatory Visit | Attending: Obstetrics and Gynecology | Admitting: Obstetrics and Gynecology

## 2016-10-17 ENCOUNTER — Ambulatory Visit (HOSPITAL_COMMUNITY)
Admission: RE | Admit: 2016-10-17 | Discharge: 2016-10-17 | Disposition: A | Discharge status: Home / Self Care | Payer: Self-pay | Source: Ambulatory Visit | Attending: Obstetrics and Gynecology | Admitting: Obstetrics and Gynecology

## 2016-10-17 VITALS — BP 160/80 | Temp 97.8°F

## 2016-10-17 DIAGNOSIS — N644 Mastodynia: Secondary | ICD-10-CM

## 2016-10-17 DIAGNOSIS — N6310 Unspecified lump in the right breast, unspecified quadrant: Secondary | ICD-10-CM

## 2016-10-17 DIAGNOSIS — Z1239 Encounter for other screening for malignant neoplasm of breast: Secondary | ICD-10-CM

## 2016-10-17 NOTE — Progress Notes (Signed)
Complaints of right breast pain when touched. Patient rates the pain at a 10 out of 10.  Pap Smear: Pap smear not completed today. Last Pap smear was 06/17/2016 at the free cervical cancer screening at [facility] and normal. Per patient has no history of an abnormal Pap smear. Last Pap smear result is in EPIC.  Physical exam: Breasts Breasts symmetrical. No skin abnormalities bilateral breasts. No nipple retraction bilateral breasts. No nipple discharge bilateral breasts. No lymphadenopathy. No lumps palpated bilateral breasts.Complaints of left lower and right diffuse breast pain on exam. Referred patient to the [facility] for diagnostic mammogram. Appointment scheduled for Thursday, October 17, 2016 at 1040.        Pelvic/Bimanual No Pap smear completed today since last Pap smear was 06/17/2016. Pap smear not indicated per BCCCP guidelines.   Smoking History: Patient has never smoked.  Patient Navigation: Patient education provided. Access to services provided for patient through [facility] program. Spanish interpreter provided.  Colorectal Cancer Screening: Per patient had a colonoscopy completed 5 years ago. No complaints today. FIT Test given to patient to complete and return to BCCCP.  Used Spanish interpreter [employer] from [place].

## 2016-10-17 NOTE — Patient Instructions (Signed)
Explained breast self awareness with Gail Manning. Patient did not need a Pap smear today due to last Pap smear was 06/17/2016. Let her know BCCCP will cover Pap smears every 3 years unless has a history of abnormal Pap smears. Referred patient to the [place] for diagnostic mammogram. Appointment scheduled for Thursday, October 17, 2016 at 1040. Gail Manning verbalized understanding.  Gamal Todisco, Arvil Chaco, RN 1:25 PM

## 2016-10-18 ENCOUNTER — Encounter (HOSPITAL_COMMUNITY): Payer: Self-pay | Admitting: *Deleted

## 2016-11-01 ENCOUNTER — Ambulatory Visit (HOSPITAL_BASED_OUTPATIENT_CLINIC_OR_DEPARTMENT_OTHER): Payer: Self-pay | Admitting: Hematology

## 2016-11-01 VITALS — BP 152/65 | HR 84 | Temp 97.7°F | Resp 20 | Ht 62.0 in | Wt 132.3 lb

## 2016-11-01 DIAGNOSIS — N644 Mastodynia: Secondary | ICD-10-CM

## 2016-11-01 DIAGNOSIS — I890 Lymphedema, not elsewhere classified: Secondary | ICD-10-CM

## 2016-11-01 DIAGNOSIS — D0511 Intraductal carcinoma in situ of right breast: Secondary | ICD-10-CM

## 2016-11-01 DIAGNOSIS — E119 Type 2 diabetes mellitus without complications: Secondary | ICD-10-CM

## 2016-11-01 DIAGNOSIS — I10 Essential (primary) hypertension: Secondary | ICD-10-CM

## 2016-11-01 DIAGNOSIS — Z86000 Personal history of in-situ neoplasm of breast: Secondary | ICD-10-CM

## 2016-11-01 NOTE — Progress Notes (Signed)
[place]  [phone]) [phone] Fax:([phone]) [phone]  Clinic follow up Note   Patient Care Team: Patient, No Pcp Per as PCP - General ([facility]) Sylvan Cheese, NP as Nurse Practitioner (Hematology and Oncology) Truitt Merle, MD as Consulting Physician (Hematology) Excell Seltzer, MD as Consulting Physician (General Surgery) Thea Silversmith, MD (Inactive) as Consulting Physician (Radiation Oncology) 11/01/2016  CHIEF COMPLAINTS:  Follow Up right breast DCIS   Oncology History   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, right   Staging form: Breast, AJCC 7th Edition     Clinical stage from 01/16/2015: Stage 0 (Tis (DCIS), N0, M0) - Signed by Truitt Merle, MD on 01/25/2015     Pathologic stage from 02/10/2015: Stage 0 (Tis (DCIS), N0, cM0) - Signed by Truitt Merle, MD on 04/28/2015        Ductal carcinoma in situ (DCIS) of right breast   01/2015 Mammogram    small area of calcification in right breast       01/16/2015 Initial Biopsy    Right breast core needle bx: DCIS ER+ (100%), PR+ (30%)      01/16/2015 Clinical Stage    Stage 0: Tis N0      02/10/2015 Surgery     right breast lumpectomy      02/10/2015 Pathology Results     right breast lumpectomy showed DCIS with calcification, intermediate grade,  anterior margin focally less than 0.1 cm, re-excision margins were negative       02/10/2015 Pathologic Stage    Stage 0: Tis No      03/29/2015 - 04/26/2015 Radiation Therapy     adjuvant breast irradiation       Anti-estrogen oral therapy    Declined       Survivorship    Care plan (in english and [place]) mailed to patient in lieu of in person visit       HISTORY OF PRESENTING ILLNESS:  Gail Manning 71 y.o. Spanish-speaking female is here because of her recently diagnosed right breast cancer. She is accompanied her husband and interpreter Gregary Signs to our multidisciplinary breast clinic today.  This was discovered by screening  mammogram. She had no palpable breast mass, no skin or nipple change. She denies any pain, or other new symptoms. She has good appetite and energy level, no recent weight loss. She recently had some left hearing issue, will see a ENT. 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 She lives with her husband, does not exercise regularly, but is physically active.   GYN HISTORY  Menarchal: 17 LMP: 53 Contraceptive: No       HRT: 4-5 months  G6P3: (+) breast feeding   CURRENT THERAPY: Observation  INTERIM HISTORY:  Gail Manning returns for follow up for her breast cancer. She was last seen by me 1 year ago. She is accompanied by her husband and a Dentist who facilitated our visit today.   She was worried about needing another mammogram. She had a lot of pain in her breast when she wen tot see her PCP. That is why they did another mammogram which was normal. She does still have breast tenderness. At first the pain was a 10/10 and today it is a 5/10. There is no erythema. She has restarted at home PE exercises. She had her question about her breast surgery answered already. She wonders if her scar tissue is healed and if this is causing her pain. She says is around  the biopsy site that hurts most, like there is a wire in place poking her. Her biopsy was 12/2014. She has not seen her surgeon in about a year.     MEDICAL HISTORY:  Past Medical History:  Diagnosis Date  . Breast cancer ([facility]) 02/2015   Right Breast  . 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 Diabetes mellitus without complication ([facility])   . Hyperlipidemia   . Hypertension   . Personal history of radiation therapy 2017   Right Breast Cancer    SURGICAL HISTORY: Past Surgical History:  Procedure Laterality Date  . BREAST LUMPECTOMY Right 02/2015  . BREAST LUMPECTOMY WITH NEEDLE LOCALIZATION Right 02/10/2015   Procedure: RIGHT BREAST LUMPECTOMY WITH NEEDLE LOCALIZATION;  Surgeon:  Excell Seltzer, MD;  Location: [facility];  Service: General;  Laterality: Right;  . CESAREAN SECTION     x3    SOCIAL HISTORY: Social History   Social History  . Marital status: Married    Spouse name: N/A  . Number of children: N/A  . Years of education: N/A   Occupational History  . Not on file.   Social History Main Topics  . Smoking status: Never Smoker  . Smokeless tobacco: Never Used  . Alcohol use No  . Drug use: No  . Sexual activity: Not on file   Other Topics Concern  . Not on file   Social History Narrative  . No narrative on file    FAMILY HISTORY: Family History  Problem Relation Age of Onset  . Diabetes Mother   . Hypertension Mother   . Rheum arthritis Mother   . Cancer Father        colon  . Diabetes Father   . Hypertension Father   . Breast cancer Sister   . Hypertension Sister     ALLERGIES:  is allergic to albuterol sulfate.  MEDICATIONS:  Current Outpatient Prescriptions  Medication Sig Dispense Refill  . aspirin EC 81 MG tablet Take 81 mg by mouth daily.    . calcium & magnesium carbonates (MYLANTA) 188-416 MG tablet Take 3 tablets by mouth daily.    . Calcium Carb-Cholecalciferol (CALCIUM 1000 + D PO) Take 2 tablets by mouth daily.    . Hypromellose (SYSTANE OVERNIGHT THERAPY OP) Apply 1 drop to eye 3 (three) times daily. 1 drop in each eye TID.    Marland Kitchen lisinopril (PRINIVIL,ZESTRIL) 20 MG tablet Take 20 mg by mouth daily.    Marland Kitchen lovastatin (MEVACOR) 20 MG tablet Take 20 mg by mouth at bedtime.    . metFORMIN (GLUCOPHAGE) 1000 MG tablet Take 1,000 mg by mouth 2 (two) times daily with a meal. Reported on 01/25/2015    . nystatin (MYCOSTATIN/NYSTOP) powder Apply topically 3 (three) times daily. 15 g 1  . vitamin E 1000 UNIT capsule Take 1,000 Units by mouth daily.    Marland Kitchen VITAMIN E SKIN OIL Apply topically daily.     No current facility-administered medications for this visit.     REVIEW OF SYSTEMS:   Constitutional: Denies  fevers, chills or abnormal night sweats Eyes: Denies blurriness of vision, double vision or watery eyes Ears, nose, mouth, throat, and face: Denies mucositis or sore throat Respiratory: Denies cough, dyspnea or wheezes Cardiovascular: Denies palpitation, chest discomfort or lower extremity swelling Gastrointestinal:  Denies nausea, heartburn or change in bowel habits Genitourinary: negative  Skin: Denies abnormal skin rashes Lymphatics: Denies new lymphadenopathy or easy bruising Neurological:Denies numbness, tingling or new weaknesses  BREAST: (+) Pain in right breast Behavioral/Psych: Mood is stable, no new changes  All other systems were reviewed with the patient and are negative.  PHYSICAL EXAMINATION: ECOG PERFORMANCE STATUS: 1 - Symptomatic but completely ambulatory  Vitals:   11/01/16 1412  BP: (!) 152/65  Pulse: 84  Resp: 20  Temp: 97.7 F (36.5 C)  SpO2: 100%   Filed Weights   11/01/16 1412  Weight: 132 lb 4.8 oz (60 kg)    GENERAL:alert, no distress and comfortable SKIN: skin color, texture, turgor are normal, no rashes or significant lesions EYES: normal, conjunctiva are pink and non-injected, sclera clear OROPHARYNX:no exudate, no erythema and lips, buccal mucosa, and tongue normal  NECK: supple, thyroid normal size, non-tender, without nodularity LYMPH:  no palpable lymphadenopathy in the cervical, axillary or inguinal LUNGS: clear to auscultation and percussion with normal breathing effort HEART: regular rate & rhythm and no murmurs and no lower extremity edema ABDOMEN:abdomen soft, non-tender and normal bowel sounds Musculoskeletal:no cyanosis of digits and no clubbing  PSYCH: alert & oriented x 3 with fluent speech NEURO: no focal motor/sensory deficits Breasts: Breast inspection showed them to be symmetrical with no nipple discharge. (+) Surgical scar around right nipple with Tenderness at incision site, healed well, no other palpable mass in both breasts  and axilla.   LABORATORY DATA:  I have reviewed the data as listed  No new lab  PATHOLOGY REPORT  Diagnosis 02/10/2015 1. Breast, lumpectomy, right - DUCTAL CARCINOMA IN SITU WITH CALCIFICATIONS, INTERMEDIATE GRADE. - DUCTAL CARCINOMA IN SITU COMES TO WITHIN <0.1 CM OF THE ANTERIOR MARGIN FOCALLY. - BIOPSY SITE CHANGE. - FIBROCYSTIC CHANGE AND SCLEROSING ADENOSIS. - SEE ONCOLOGY TABLE. 2. Breast, excision, right further lateral margin - BENIGN BREAST TISSUE WITH USUAL DUCTAL HYPERPLASIA. - NO CARCINOMA IDENTIFIED. 3. Breast, excision, right further inferior margin - BENIGN BREAST TISSUE WITH FIBROCYSTIC CHANGE. - NO CARCINOMA IDENTIFIED. 4. Breast, excision, right shave of lateral margin - BENIGN BREAST TISSUE. - NO CARCINOMA IDENTIFIED.  Microscopic Comment 1. BREAST, IN SITU CARCINOMA Specimen, including laterality: Right breast lump and additional margins. Procedure (include lymph node sampling sentinel-non-sentinel): Right breast lumpectomy and additional margin resection. Grade of carcinoma: II Necrosis: Single cell. Estimated tumor size: (block estimate): 0.6 cm, see comment. Treatment effect: N/A. Distance to closest margin: <0.1 cm from anterior margin focally. If margin positive, focally or broadly: See above. Breast prognostic profile: [id number], see below. Will not be repeated. Estrogen receptor: Positive, 100%. Progesterone receptor: Positive, 30%. Lymph nodes: Not sampled. TNM: pTis, pNX Comments: There is DCIS within 2 blocks and thus the size is estimated at 0.6 cm (0.3 cm per block). Immunohistochemistry was performed on one block and shows preserved basal markers (p63, smooth muscle myosin, and calponin).  RADIOGRAPHIC STUDIES: I have personally reviewed the radiological images as listed and agreed with the findings in the report. Mm Diag Breast Tomo Bilateral  Result Date: 10/17/2016 CLINICAL DATA:  Status post right lumpectomy and radiation therapy  for breast cancer in 2017. She has nonfocal pain and tenderness in the region of the lumpectomy scar and surrounding breast. She also has nonfocal pain in the left breast. EXAM: 2D DIGITAL DIAGNOSTIC BILATERAL MAMMOGRAM WITH CAD AND ADJUNCT TOMO COMPARISON:  Previous exam(s). ACR Breast Density Category c: The breast tissue is heterogeneously dense, which may obscure small masses. FINDINGS: Stable post lumpectomy and postradiation changes on the left. No interval findings suspicious for malignancy in either breast. Mammographic images were processed with CAD. IMPRESSION: No evidence  of malignancy. RECOMMENDATION: Bilateral diagnostic mammogram in 1 year. I have discussed the findings and recommendations with the patient. Results were also provided in writing at the conclusion of the visit. If applicable, a reminder letter will be sent to the patient regarding the next appointment. BI-RADS CATEGORY  2: Benign. Electronically Signed   By: Claudie Revering M.D.   On: 10/17/2016 11:17    ASSESSMENT & PLAN:  71 y.o. year-old postmenopausal woman, presented with screening discovered DCIS.  1. Right breast DCIS, intermediate grade, ER/PR strongly positive -I previously discussed her breast lumpectomy surgical pathology results with patient and her husband in great detail. --Her DCIS has been cured by complete surgical resection. The surgical margins were negative, cleared by her surgeon. Any form of adjuvant therapy is preventive. -Pt received adjuvant breast radiation  -she decided not to take  Antiestrogen therapy. -We discussed breast cancer surveillance, Including annual mammogram, breast exam every 6-12 months. -Mammogram on 03/29/16 showed no mammographic evidence of breast malignancy.  - I encouraged her to have a healthy diet and be physically active  -Overall she is clinically doing well, breast exam was unremarkable, no concern for recurrence. -F/u in 1 year    2. Right breast pain and lymphedema    -The patient has taken physical therapy and continues to do the exercises at home. -She has had some breast pain since surgery, reports recent developed severe breast tenderness but it is improving -She can take Tylenol or Motrin as needed for pain -She went to PCP for breast pain and another mammogram was done, and results were normal in 10/2016, no palpable mass upon breast exam. -She has restarted PE exercises at home for her lymphedema.  -Her pain is getting better, used to be 10/10 and now a 5/10 -I suggest ibuprofen for her pain.   3. 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 The patient knows to call the clinic with any problems, questions or concerns. I spent 15 minutes counseling the patient face to face. The total time spent in the appointment was 20 minutes and more than 50% was on counseling.  This document serves as a record of services personally performed by Truitt Merle, MD. It was created on her behalf by Joslyn Devon, a trained medical scribe. The creation of this record is based on the scribe's personal observations and the provider's statements to them. This document has been checked and approved by the attending provider.     Truitt Merle, MD 11/01/2016

## 2016-11-03 ENCOUNTER — Encounter: Payer: Self-pay | Admitting: Hematology

## 2016-11-05 ENCOUNTER — Telehealth: Payer: Self-pay | Admitting: Hematology

## 2016-11-05 NOTE — Telephone Encounter (Signed)
F/u in one year - reminder letter sent in the mail per 10/26 los.

## 2016-11-08 ENCOUNTER — Other Ambulatory Visit: Payer: Self-pay

## 2016-11-11 ENCOUNTER — Encounter (HOSPITAL_COMMUNITY): Payer: Self-pay | Admitting: *Deleted

## 2016-11-11 LAB — FECAL OCCULT BLOOD, IMMUNOCHEMICAL: Fecal Occult Bld: NEGATIVE

## 2016-11-11 LAB — SPECIMEN STATUS REPORT

## 2016-11-11 NOTE — Progress Notes (Signed)
Letter mailed with negative Fit Test results.

## 2017-04-25 ENCOUNTER — Inpatient Hospital Stay: Payer: No Typology Code available for payment source | Admitting: Hematology

## 2017-09-29 ENCOUNTER — Other Ambulatory Visit (HOSPITAL_COMMUNITY): Payer: Self-pay | Admitting: *Deleted

## 2017-09-29 DIAGNOSIS — Z853 Personal history of malignant neoplasm of breast: Secondary | ICD-10-CM

## 2017-10-03 ENCOUNTER — Telehealth: Payer: Self-pay | Admitting: Hematology

## 2017-10-03 NOTE — Telephone Encounter (Signed)
Scheduled appt per 9/26 sch message - interpreter Almyra Free is aware of appt date and time and will let Pt know appt date and time.

## 2017-10-31 ENCOUNTER — Ambulatory Visit: Payer: No Typology Code available for payment source | Admitting: Hematology

## 2017-12-11 ENCOUNTER — Encounter (HOSPITAL_COMMUNITY): Payer: Self-pay

## 2017-12-11 ENCOUNTER — Encounter (HOSPITAL_COMMUNITY): Payer: Self-pay | Admitting: *Deleted

## 2017-12-11 ENCOUNTER — Other Ambulatory Visit (HOSPITAL_COMMUNITY): Payer: Self-pay | Admitting: Obstetrics and Gynecology

## 2017-12-11 ENCOUNTER — Ambulatory Visit (HOSPITAL_COMMUNITY)
Admission: RE | Admit: 2017-12-11 | Discharge: 2017-12-11 | Disposition: A | Discharge status: Home / Self Care | Payer: No Typology Code available for payment source | Source: Ambulatory Visit | Attending: Obstetrics and Gynecology | Admitting: Obstetrics and Gynecology

## 2017-12-11 ENCOUNTER — Ambulatory Visit
Admission: RE | Admit: 2017-12-11 | Discharge: 2017-12-11 | Disposition: A | Discharge status: Home / Self Care | Payer: No Typology Code available for payment source | Source: Ambulatory Visit | Attending: Obstetrics and Gynecology | Admitting: Obstetrics and Gynecology

## 2017-12-11 VITALS — BP 142/80

## 2017-12-11 DIAGNOSIS — N644 Mastodynia: Secondary | ICD-10-CM

## 2017-12-11 DIAGNOSIS — Z853 Personal history of malignant neoplasm of breast: Secondary | ICD-10-CM

## 2017-12-11 DIAGNOSIS — Z1239 Encounter for other screening for malignant neoplasm of breast: Secondary | ICD-10-CM

## 2017-12-11 NOTE — Patient Instructions (Signed)
Explained breast self awareness with [name]. Patient did not need a Pap smear today due to last Pap smear was 06/17/2016. Let her know [facility] will cover Pap smears every 3 years unless has a history of abnormal Pap smears. Referred patient to the [facility] for diagnostic mammogram per recommendation. Appointment scheduled for Thursday, December 11, 2017 at 1250.  Patient aware of appointment and will be there. Gail Manning verbalized understanding.  Rayleen Wyrick, Arvil Chaco, RN 12:01 PM

## 2017-12-11 NOTE — Progress Notes (Signed)
Complaints of right breast pain that has been there for years. Patient rates the pain at a 5 out of 10.   Pap Smear: Pap smear not completed today. Last Pap smear was 06/17/2016 at the free cervical cancer screening at [facility] and normal. Per patient has no history of an abnormal Pap smear. Last Pap smear result is in EPIC.  Physical exam: Breasts Breasts symmetrical. No skin abnormalities bilateral breasts. No nipple retraction bilateral breasts. No nipple discharge bilateral breasts. No lymphadenopathy. No lumps palpated bilateral breasts.Complaints of left outer and right diffuse breast pain on exam. Referred patient to the [facility] for diagnostic mammogram per recommendation. Appointment scheduled for Thursday, December 11, 2017 at 1250.         Pelvic/Bimanual No Pap smear completed today since last Pap smear was 06/17/2016. Pap smear not indicated per [facility] guidelines.   Smoking History: Patient has never smoked.  Patient Navigation: Patient education provided. Access to services provided for patient through [facility] program. Spanish interpreter provided.   Colorectal Cancer Screening: Per patient had a colonoscopy completed 6 or 7 years ago. FIT Test completed 11/08/2016 that was negative. No complaints today. FIT Test given to patient to complete and return to [facility].  Breast and Cervical Cancer Risk Assessment: Patient has a family history of a sister having breast cancer. Patient has a personal history of breast cancer. Patient has no known genetic mutations or history of radiation treatment to the chest before age 72. Patient has no history of cervical dysplasia, immunocompromised, or DES exposure in-utero.  Risk Assessment    Risk Scores      12/11/2017   Last edited by: Armond Hang, LPN   5-year risk: 2.7 %   Lifetime risk: 7.8 %         Used Spanish interpreter Rudene Anda from [place].

## 2017-12-15 ENCOUNTER — Other Ambulatory Visit: Payer: Self-pay
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
## 2017-12-18 ENCOUNTER — Encounter: Payer: Self-pay | Admitting: Hematology

## 2017-12-18 ENCOUNTER — Inpatient Hospital Stay: Payer: Self-pay | Attending: Hematology | Admitting: Hematology

## 2017-12-18 VITALS — BP 144/63 | HR 65 | Temp 98.3°F | Resp 18 | Ht 62.0 in | Wt 141.3 lb

## 2017-12-18 DIAGNOSIS — I10 Essential (primary) hypertension: Secondary | ICD-10-CM | POA: Insufficient documentation

## 2017-12-18 DIAGNOSIS — I890 Lymphedema, not elsewhere classified: Secondary | ICD-10-CM | POA: Insufficient documentation

## 2017-12-18 DIAGNOSIS — Z79899 Other long term (current) drug therapy: Secondary | ICD-10-CM | POA: Insufficient documentation

## 2017-12-18 DIAGNOSIS — E119 Type 2 diabetes mellitus without complications: Secondary | ICD-10-CM | POA: Insufficient documentation

## 2017-12-18 DIAGNOSIS — Z7982 Long term (current) use of aspirin: Secondary | ICD-10-CM | POA: Insufficient documentation

## 2017-12-18 DIAGNOSIS — R05 Cough: Secondary | ICD-10-CM | POA: Insufficient documentation

## 2017-12-18 DIAGNOSIS — R0789 Other chest pain: Secondary | ICD-10-CM | POA: Insufficient documentation

## 2017-12-18 DIAGNOSIS — E785 Hyperlipidemia, unspecified: Secondary | ICD-10-CM | POA: Insufficient documentation

## 2017-12-18 DIAGNOSIS — Z7984 Long term (current) use of oral hypoglycemic drugs: Secondary | ICD-10-CM | POA: Insufficient documentation

## 2017-12-18 DIAGNOSIS — R197 Diarrhea, unspecified: Secondary | ICD-10-CM | POA: Insufficient documentation

## 2017-12-18 DIAGNOSIS — D0511 Intraductal carcinoma in situ of right breast: Secondary | ICD-10-CM | POA: Insufficient documentation

## 2017-12-18 DIAGNOSIS — Z923 Personal history of irradiation: Secondary | ICD-10-CM | POA: Insufficient documentation

## 2017-12-18 DIAGNOSIS — Z170 Estrogen receptor positive status [ER+]: Secondary | ICD-10-CM | POA: Insufficient documentation

## 2017-12-18 DIAGNOSIS — R0600 Dyspnea, unspecified: Secondary | ICD-10-CM | POA: Insufficient documentation

## 2017-12-18 DIAGNOSIS — N644 Mastodynia: Secondary | ICD-10-CM | POA: Insufficient documentation

## 2017-12-21 LAB — FECAL OCCULT BLOOD, IMMUNOCHEMICAL: Fecal Occult Bld: NEGATIVE

## 2017-12-22 ENCOUNTER — Encounter (HOSPITAL_COMMUNITY): Payer: Self-pay

## 2018-07-27 ENCOUNTER — Other Ambulatory Visit (HOSPITAL_COMMUNITY): Payer: Self-pay | Admitting: *Deleted

## 2018-07-27 DIAGNOSIS — Z853 Personal history of malignant neoplasm of breast: Secondary | ICD-10-CM

## 2018-08-18 ENCOUNTER — Ambulatory Visit (HOSPITAL_COMMUNITY): Payer: No Typology Code available for payment source

## 2018-09-10 ENCOUNTER — Ambulatory Visit (HOSPITAL_COMMUNITY): Payer: No Typology Code available for payment source

## 2018-12-15 ENCOUNTER — Ambulatory Visit (HOSPITAL_COMMUNITY): Payer: Self-pay

## 2019-01-07 ENCOUNTER — Ambulatory Visit
Admission: RE | Admit: 2019-01-07 | Discharge: 2019-01-07 | Disposition: A | Discharge status: Home / Self Care | Payer: No Typology Code available for payment source | Source: Ambulatory Visit | Attending: Obstetrics and Gynecology | Admitting: Obstetrics and Gynecology

## 2019-01-07 ENCOUNTER — Other Ambulatory Visit: Payer: Self-pay

## 2019-01-07 ENCOUNTER — Encounter (HOSPITAL_COMMUNITY): Payer: Self-pay

## 2019-01-07 ENCOUNTER — Ambulatory Visit (HOSPITAL_COMMUNITY)
Admission: RE | Admit: 2019-01-07 | Discharge: 2019-01-07 | Disposition: A | Discharge status: Home / Self Care | Payer: Self-pay | Source: Ambulatory Visit | Attending: Obstetrics and Gynecology | Admitting: Obstetrics and Gynecology

## 2019-01-07 DIAGNOSIS — Z1239 Encounter for other screening for malignant neoplasm of breast: Secondary | ICD-10-CM | POA: Insufficient documentation

## 2019-01-07 DIAGNOSIS — N644 Mastodynia: Secondary | ICD-10-CM

## 2019-01-07 DIAGNOSIS — Z853 Personal history of malignant neoplasm of breast: Secondary | ICD-10-CM

## 2019-01-07 HISTORY — DX: Full incontinence of feces: R15.9

## 2019-01-07 HISTORY — DX: Pigmentary retinal dystrophy: H35.52

## 2019-01-07 HISTORY — DX: Full incontinence of feces: R32

## 2019-01-07 HISTORY — DX: Unspecified cataract: H26.9

## 2019-01-07 NOTE — Progress Notes (Signed)
Complaints of bilateral outer and lower breast pain that has been there since February 2017. Patient states the pain comes and goes. Patient rates the pain at a 5 out of 10.    Diagnostic mammogram completed 12/11/2017 recommended a diagnostic mammogram in one year.   Pap Smear: Pap smear not completed today. Last Pap smear was 06/17/2016 at the free cervical cancer screening at [facility] and normal. Per patient has no history of an abnormal Pap smear. Last Pap smear result is in EPIC.  Physical exam: Breasts Breasts symmetrical. No skin abnormalities bilateral breasts. No nipple retraction bilateral breasts. No nipple discharge bilateral breasts. No lymphadenopathy. No lumps palpated bilateral breasts. Complaints of bilateral diffuse breast pain on exam. Referred patient to the [facility] for a diagnostic mammogram per recommendation. Appointment scheduled for Thursday, January 07, 2019 at 1400.        Pelvic/Bimanual No Pap smear completed today since last Pap smear was6/11/2016. Pap smear not indicated per BCCCP guidelines.  Smoking History: Patient has never smoked.  Patient Navigation: Patient education provided. Access to services provided for patient through [facility] program. Spanish interpreter provided.   Colorectal Cancer Screening: Per patient had a colonoscopy completed7 or 8 years ago. FIT Test completed 11/08/2016 that was negative.  Breast and Cervical Cancer Risk Assessment: Patient has a family history of a sister having breast cancer. Patient has a personal history of breast cancer. Patient has no known genetic mutations or history of radiation treatment to the chest before age 59. Patient has no history of cervical dysplasia, immunocompromised, or DES exposure in-utero.  Risk Assessment    Risk Scores      01/07/2019 12/11/2017   Last edited by: Loletta Parish, RN Armond Hang, LPN   5-year risk: 2.7 % 2.7 %   Lifetime risk:  7.3 % 7.8 %         Used Spanish interpreter Rudene Anda from [place].

## 2019-01-07 NOTE — Patient Instructions (Addendum)
Explained breast self awareness with Gail Manning. Patient did not need a Pap smear today due to last Pap smear was 06/17/2016. Let her know no further Pap smears are recommended since she is over 65 with no history of an abnormal Pap smear. Referred patient to the [facility] for a diagnostic mammogram per recommendation. Appointment scheduled for Thursday, January 07, 2019 at 1400. Patient aware of appointment and will be there. Gail Manning verbalized understanding.  Gail Manning, Gail Chaco, RN 10:16 AM

## 2019-01-15 ENCOUNTER — Telehealth: Payer: Self-pay | Admitting: Hematology

## 2019-01-15 NOTE — Telephone Encounter (Signed)
Scheduled per 1/8 sch msg. Called and spoke with Almyra Free (interpreter), confirmed 2/11 appt
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
## 2019-02-17 ENCOUNTER — Other Ambulatory Visit: Payer: Self-pay

## 2019-02-17 DIAGNOSIS — D0511 Intraductal carcinoma in situ of right breast: Secondary | ICD-10-CM

## 2019-02-18 ENCOUNTER — Other Ambulatory Visit: Payer: Self-pay

## 2019-02-18 ENCOUNTER — Encounter: Payer: Self-pay | Admitting: Hematology

## 2019-02-18 ENCOUNTER — Inpatient Hospital Stay: Payer: Self-pay

## 2019-02-18 ENCOUNTER — Inpatient Hospital Stay: Payer: Self-pay | Attending: Hematology | Admitting: Hematology

## 2019-02-18 VITALS — BP 167/78 | HR 91 | Temp 100.8°F | Resp 20 | Ht 62.0 in | Wt 142.3 lb

## 2019-02-18 DIAGNOSIS — Z7982 Long term (current) use of aspirin: Secondary | ICD-10-CM | POA: Insufficient documentation

## 2019-02-18 DIAGNOSIS — M79603 Pain in arm, unspecified: Secondary | ICD-10-CM | POA: Insufficient documentation

## 2019-02-18 DIAGNOSIS — E1136 Type 2 diabetes mellitus with diabetic cataract: Secondary | ICD-10-CM | POA: Insufficient documentation

## 2019-02-18 DIAGNOSIS — Z79899 Other long term (current) drug therapy: Secondary | ICD-10-CM | POA: Insufficient documentation

## 2019-02-18 DIAGNOSIS — Z7984 Long term (current) use of oral hypoglycemic drugs: Secondary | ICD-10-CM | POA: Insufficient documentation

## 2019-02-18 DIAGNOSIS — I10 Essential (primary) hypertension: Secondary | ICD-10-CM | POA: Insufficient documentation

## 2019-02-18 DIAGNOSIS — E785 Hyperlipidemia, unspecified: Secondary | ICD-10-CM | POA: Insufficient documentation

## 2019-02-18 DIAGNOSIS — D0511 Intraductal carcinoma in situ of right breast: Secondary | ICD-10-CM | POA: Insufficient documentation

## 2019-02-18 DIAGNOSIS — Z923 Personal history of irradiation: Secondary | ICD-10-CM | POA: Insufficient documentation

## 2019-02-18 DIAGNOSIS — Z170 Estrogen receptor positive status [ER+]: Secondary | ICD-10-CM | POA: Insufficient documentation

## 2019-02-18 DIAGNOSIS — N644 Mastodynia: Secondary | ICD-10-CM | POA: Insufficient documentation

## 2019-02-18 LAB — CBC WITH DIFFERENTIAL (CANCER CENTER ONLY)
Abs Immature Granulocytes: 0.01 10*3/uL (ref 0.00–0.07)
Basophils Absolute: 0 10*3/uL (ref 0.0–0.1)
Basophils Relative: 0 %
Eosinophils Absolute: 0 10*3/uL (ref 0.0–0.5)
Eosinophils Relative: 1 %
HCT: 37.1 % (ref 36.0–46.0)
Hemoglobin: 12.7 g/dL (ref 12.0–15.0)
Immature Granulocytes: 0 %
Lymphocytes Relative: 28 %
Lymphs Abs: 1.7 10*3/uL (ref 0.7–4.0)
MCH: 31.7 pg (ref 26.0–34.0)
MCHC: 34.2 g/dL (ref 30.0–36.0)
MCV: 92.5 fL (ref 80.0–100.0)
Monocytes Absolute: 0.7 10*3/uL (ref 0.1–1.0)
Monocytes Relative: 11 %
Neutro Abs: 3.6 10*3/uL (ref 1.7–7.7)
Neutrophils Relative %: 60 %
Platelet Count: 206 10*3/uL (ref 150–400)
RBC: 4.01 MIL/uL (ref 3.87–5.11)
RDW: 13.3 % (ref 11.5–15.5)
WBC Count: 6 10*3/uL (ref 4.0–10.5)
nRBC: 0 % (ref 0.0–0.2)

## 2019-02-18 LAB — CMP (CANCER CENTER ONLY)
ALT: 53 U/L — ABNORMAL HIGH (ref 0–44)
AST: 42 U/L — ABNORMAL HIGH (ref 15–41)
Albumin: 4.4 g/dL (ref 3.5–5.0)
Alkaline Phosphatase: 95 U/L (ref 38–126)
Anion gap: 10 (ref 5–15)
BUN: 7 mg/dL — ABNORMAL LOW (ref 8–23)
CO2: 27 mmol/L (ref 22–32)
Calcium: 9.7 mg/dL (ref 8.9–10.3)
Chloride: 102 mmol/L (ref 98–111)
Creatinine: 0.72 mg/dL (ref 0.44–1.00)
GFR, Est AFR Am: >60 mL/min (ref >60)
GFR, Estimated: >60 mL/min (ref >60)
Glucose, Bld: 144 mg/dL — ABNORMAL HIGH (ref 70–99)
Potassium: 4.7 mmol/L (ref 3.5–5.1)
Sodium: 139 mmol/L (ref 135–145)
Total Bilirubin: 0.4 mg/dL (ref 0.3–1.2)
Total Protein: 7.8 g/dL (ref 6.5–8.1)

## 2019-02-19 ENCOUNTER — Telehealth: Payer: Self-pay | Admitting: Nurse Practitioner

## 2019-02-19 NOTE — Telephone Encounter (Signed)
Scheduled appt per 2/11 los.  Sent a message to HIM pool to get a calendar mailed out. 

## 2019-02-24 ENCOUNTER — Telehealth: Payer: Self-pay

## 2019-02-24 NOTE — Telephone Encounter (Signed)
Spoke with patient's husband via interpreter line to let him know that per Dr. Burr Medico Gail Manning's liver enzymes were elevated and Dr. Burr Medico would like Gail Manning to come in for repeat lab work in 3 months. Husband verbalized understanding and agreement, and denied any other needs at this time.   Scheduling message sent to call patient to schedule lab appointment in 3 months.

## 2019-03-01 ENCOUNTER — Telehealth: Payer: Self-pay | Admitting: Hematology

## 2019-03-01 NOTE — Telephone Encounter (Signed)
Scheduled 5/21 appt per 2/17 sch msg. Pt's husband is aware of appt date and time.

## 2019-05-27 ENCOUNTER — Telehealth: Payer: Self-pay | Admitting: Adult Health

## 2019-05-28 ENCOUNTER — Other Ambulatory Visit: Payer: Self-pay

## 2019-05-28 ENCOUNTER — Encounter (INDEPENDENT_AMBULATORY_CARE_PROVIDER_SITE_OTHER): Payer: Self-pay

## 2019-05-28 ENCOUNTER — Inpatient Hospital Stay: Payer: Self-pay | Attending: Hematology

## 2019-05-28 DIAGNOSIS — D0511 Intraductal carcinoma in situ of right breast: Secondary | ICD-10-CM | POA: Insufficient documentation

## 2019-05-28 LAB — CBC WITH DIFFERENTIAL (CANCER CENTER ONLY)
Abs Immature Granulocytes: 0.02 10*3/uL (ref 0.00–0.07)
Basophils Absolute: 0 10*3/uL (ref 0.0–0.1)
Basophils Relative: 0 %
Eosinophils Absolute: 0.1 10*3/uL (ref 0.0–0.5)
Eosinophils Relative: 1 %
HCT: 40.3 % (ref 36.0–46.0)
Hemoglobin: 14 g/dL (ref 12.0–15.0)
Immature Granulocytes: 0 %
Lymphocytes Relative: 22 %
Lymphs Abs: 2 10*3/uL (ref 0.7–4.0)
MCH: 30.8 pg (ref 26.0–34.0)
MCHC: 34.7 g/dL (ref 30.0–36.0)
MCV: 88.8 fL (ref 80.0–100.0)
Monocytes Absolute: 0.8 10*3/uL (ref 0.1–1.0)
Monocytes Relative: 9 %
Neutro Abs: 6 10*3/uL (ref 1.7–7.7)
Neutrophils Relative %: 68 %
Platelet Count: 183 10*3/uL (ref 150–400)
RBC: 4.54 MIL/uL (ref 3.87–5.11)
RDW: 13.2 % (ref 11.5–15.5)
WBC Count: 9 10*3/uL (ref 4.0–10.5)
nRBC: 0 % (ref 0.0–0.2)

## 2019-05-28 LAB — CMP (CANCER CENTER ONLY)
ALT: 62 U/L — ABNORMAL HIGH (ref 0–44)
AST: 41 U/L (ref 15–41)
Albumin: 4.3 g/dL (ref 3.5–5.0)
Alkaline Phosphatase: 116 U/L (ref 38–126)
Anion gap: 12 (ref 5–15)
BUN: 14 mg/dL (ref 8–23)
CO2: 24 mmol/L (ref 22–32)
Calcium: 9.9 mg/dL (ref 8.9–10.3)
Chloride: 101 mmol/L (ref 98–111)
Creatinine: 0.82 mg/dL (ref 0.44–1.00)
GFR, Est AFR Am: >60 mL/min (ref >60)
GFR, Estimated: >60 mL/min (ref >60)
Glucose, Bld: 273 mg/dL — ABNORMAL HIGH (ref 70–99)
Potassium: 4.1 mmol/L (ref 3.5–5.1)
Sodium: 137 mmol/L (ref 135–145)
Total Bilirubin: 0.5 mg/dL (ref 0.3–1.2)
Total Protein: 7.9 g/dL (ref 6.5–8.1)

## 2019-06-08 ENCOUNTER — Telehealth: Payer: Self-pay

## 2019-06-08 NOTE — Telephone Encounter (Signed)
I spoke with Almyra Free our [place] interpreter.  She will contact Ms Gail Manning and giver her Dr. Ernestina Penna message.

## 2019-06-08 NOTE — Telephone Encounter (Signed)
-----   Message from Truitt Merle, MD sent at 06/04/2019 12:05 PM EDT ----- Please let pt know her lab results. She still has mildly elevated LFTs, and her BG is very high this time. I recommend her to f/u with PCP. She is not on any meds from [place], and this abnormal would not related to her breast DCIS. Thanks   Truitt Merle  06/04/2019

## 2019-06-08 NOTE — Telephone Encounter (Signed)
Almyra Free spoke with Ms Wynonia Musty.  Ms Wynonia Musty verbalized understanding and requested a copy of her lab results be mailed to her

## 2019-10-18 ENCOUNTER — Other Ambulatory Visit: Payer: Self-pay

## 2019-10-18 DIAGNOSIS — D0511 Intraductal carcinoma in situ of right breast: Secondary | ICD-10-CM

## 2020-01-13 ENCOUNTER — Ambulatory Visit: Payer: Self-pay | Admitting: *Deleted

## 2020-01-13 ENCOUNTER — Ambulatory Visit
Admission: RE | Admit: 2020-01-13 | Discharge: 2020-01-13 | Disposition: A | Discharge status: Home / Self Care | Payer: No Typology Code available for payment source | Source: Ambulatory Visit | Attending: Obstetrics and Gynecology | Admitting: Obstetrics and Gynecology

## 2020-01-13 ENCOUNTER — Other Ambulatory Visit: Payer: Self-pay | Admitting: Obstetrics and Gynecology

## 2020-01-13 ENCOUNTER — Other Ambulatory Visit: Payer: Self-pay

## 2020-01-13 VITALS — BP 156/70 | Wt 144.8 lb

## 2020-01-13 DIAGNOSIS — N644 Mastodynia: Secondary | ICD-10-CM

## 2020-01-13 DIAGNOSIS — D0511 Intraductal carcinoma in situ of right breast: Secondary | ICD-10-CM

## 2020-01-13 DIAGNOSIS — Z1239 Encounter for other screening for malignant neoplasm of breast: Secondary | ICD-10-CM

## 2020-01-13 NOTE — Progress Notes (Signed)
Gail Manning is a 75 y.o. female who presents to [facility] clinic today with no complaints. Patients previous diagnostic mammogram was completed 01/07/2019 and a diagnostic mammogram was recommended in one year.   Pap Smear: Pap smear not completed today. Last Pap smear was 06/17/2016 at the free cervical cancer screening at [facility] and normal. Per patient has no history of an abnormal Pap smear. Patient doesn't need any further Pap smears since she is over 86 years old and has no history of an abnormal Pap smear. Last Pap smear result is in EPIC.   Physical exam: Breasts Breasts symmetrical. No skin abnormalities bilateral breasts. No nipple retraction bilateral breasts. No nipple discharge bilateral breasts. No lymphadenopathy. No lumps palpated bilateral breasts. Complaints of bilateral diffuse breast pain on exam.      Pelvic/Bimanual Pap is not indicated today per BCCCP guidelines.   Smoking History: Patient has never smoked.   Patient Navigation: Patient education provided. Access to services provided for patient through [facility] program. Spanish interpreter Gail Manning from [facility] provided.   Colorectal Cancer Screening: Per patient had a colonoscopy completed8 years ago. FIT Test completed 12/15/2017 that was negative.   Breast and Cervical Cancer Risk Assessment: Patient hasafamily history of a sister havingbreast cancer. Patient has a personal history of breast cancer. Patient has noknown genetic mutations orhistory ofradiation treatment to the chest before age 63. Patient has no history of cervical dysplasia, immunocompromised, or DES exposure in-utero.  Risk Assessment    Risk Scores      01/13/2020 01/07/2019   Last edited by: Gail Rutherford, LPN Gail Manning, Gail Grippe, RN   5-year risk: 2.7 % 2.7 %   Lifetime risk: 6.9 % 7.3 %          A: BCCCP exam without pap smear No complaints.  P: Referred patient to the [facility]  for a diagnostic mammogram per recommendation. Appointment scheduled Thursday, January 13, 2020 at 1400.  Gail Heidelberg, RN 01/13/2020 1:11 PM

## 2020-01-13 NOTE — Patient Instructions (Signed)
Explained breast self awareness with Gail Manning. Patient did not need a Pap smear today due to last Pap smear was 06/17/2016 and patient is over 75 years old with no history of an abnormal Pap smear. Referred patient to the [facility] of [facility] for a diagnostic mammogram per recommendation. Appointment scheduled Thursday, January 13, 2020 at 1400. Patient aware of appointment and will be there. [name] verbalized understanding.  Raymir Frommelt, Kathaleen Maser, RN 1:11 PM
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
## 2020-02-17 ENCOUNTER — Telehealth: Payer: Self-pay | Admitting: Nurse Practitioner

## 2020-02-17 ENCOUNTER — Inpatient Hospital Stay: Payer: Self-pay

## 2020-02-17 ENCOUNTER — Inpatient Hospital Stay: Payer: Self-pay | Attending: Nurse Practitioner | Admitting: Nurse Practitioner

## 2020-02-17 ENCOUNTER — Other Ambulatory Visit: Payer: Self-pay

## 2020-02-17 ENCOUNTER — Encounter: Payer: Self-pay | Admitting: Nurse Practitioner

## 2020-02-17 VITALS — BP 163/73 | HR 77 | Temp 97.7°F | Resp 18 | Ht 62.0 in | Wt 140.5 lb

## 2020-02-17 DIAGNOSIS — R7401 Elevation of levels of liver transaminase levels: Secondary | ICD-10-CM | POA: Insufficient documentation

## 2020-02-17 DIAGNOSIS — D0511 Intraductal carcinoma in situ of right breast: Secondary | ICD-10-CM

## 2020-02-17 DIAGNOSIS — E785 Hyperlipidemia, unspecified: Secondary | ICD-10-CM | POA: Insufficient documentation

## 2020-02-17 DIAGNOSIS — Z7982 Long term (current) use of aspirin: Secondary | ICD-10-CM | POA: Insufficient documentation

## 2020-02-17 DIAGNOSIS — I10 Essential (primary) hypertension: Secondary | ICD-10-CM | POA: Insufficient documentation

## 2020-02-17 DIAGNOSIS — Z923 Personal history of irradiation: Secondary | ICD-10-CM | POA: Insufficient documentation

## 2020-02-17 DIAGNOSIS — Z79899 Other long term (current) drug therapy: Secondary | ICD-10-CM | POA: Insufficient documentation

## 2020-02-17 DIAGNOSIS — Y92410 Unspecified street and highway as the place of occurrence of the external cause: Secondary | ICD-10-CM | POA: Insufficient documentation

## 2020-02-17 DIAGNOSIS — E119 Type 2 diabetes mellitus without complications: Secondary | ICD-10-CM | POA: Insufficient documentation

## 2020-02-17 DIAGNOSIS — Z7984 Long term (current) use of oral hypoglycemic drugs: Secondary | ICD-10-CM | POA: Insufficient documentation

## 2020-02-17 DIAGNOSIS — S2220XA Unspecified fracture of sternum, initial encounter for closed fracture: Secondary | ICD-10-CM | POA: Insufficient documentation

## 2020-02-17 LAB — CBC WITH DIFFERENTIAL (CANCER CENTER ONLY)
Abs Immature Granulocytes: 0.02 10*3/uL (ref 0.00–0.07)
Basophils Absolute: 0 10*3/uL (ref 0.0–0.1)
Basophils Relative: 0 %
Eosinophils Absolute: 0.1 10*3/uL (ref 0.0–0.5)
Eosinophils Relative: 1 %
HCT: 40.3 % (ref 36.0–46.0)
Hemoglobin: 13.9 g/dL (ref 12.0–15.0)
Immature Granulocytes: 0 %
Lymphocytes Relative: 31 %
Lymphs Abs: 1.9 10*3/uL (ref 0.7–4.0)
MCH: 31.2 pg (ref 26.0–34.0)
MCHC: 34.5 g/dL (ref 30.0–36.0)
MCV: 90.4 fL (ref 80.0–100.0)
Monocytes Absolute: 0.6 10*3/uL (ref 0.1–1.0)
Monocytes Relative: 11 %
Neutro Abs: 3.4 10*3/uL (ref 1.7–7.7)
Neutrophils Relative %: 57 %
Platelet Count: 201 10*3/uL (ref 150–400)
RBC: 4.46 MIL/uL (ref 3.87–5.11)
RDW: 12.6 % (ref 11.5–15.5)
WBC Count: 6 10*3/uL (ref 4.0–10.5)
nRBC: 0 % (ref 0.0–0.2)

## 2020-02-17 LAB — CMP (CANCER CENTER ONLY)
ALT: 63 U/L — ABNORMAL HIGH (ref 0–44)
AST: 34 U/L (ref 15–41)
Albumin: 4.4 g/dL (ref 3.5–5.0)
Alkaline Phosphatase: 91 U/L (ref 38–126)
Anion gap: 10 (ref 5–15)
BUN: 13 mg/dL (ref 8–23)
CO2: 26 mmol/L (ref 22–32)
Calcium: 9.5 mg/dL (ref 8.9–10.3)
Chloride: 100 mmol/L (ref 98–111)
Creatinine: 0.81 mg/dL (ref 0.44–1.00)
GFR, Estimated: >60 mL/min (ref >60)
Glucose, Bld: 342 mg/dL — ABNORMAL HIGH (ref 70–99)
Potassium: 4.5 mmol/L (ref 3.5–5.1)
Sodium: 136 mmol/L (ref 135–145)
Total Bilirubin: 0.4 mg/dL (ref 0.3–1.2)
Total Protein: 7.8 g/dL (ref 6.5–8.1)

## 2020-02-17 NOTE — Telephone Encounter (Signed)
Scheduled appointments per 2/10 los. Spoke to patient who is aware of appointments dates and times.

## 2020-12-26 ENCOUNTER — Other Ambulatory Visit: Payer: Self-pay

## 2020-12-26 DIAGNOSIS — Z1231 Encounter for screening mammogram for malignant neoplasm of breast: Secondary | ICD-10-CM

## 2021-02-09 ENCOUNTER — Other Ambulatory Visit: Payer: Self-pay | Admitting: *Deleted

## 2021-02-09 DIAGNOSIS — D0511 Intraductal carcinoma in situ of right breast: Secondary | ICD-10-CM

## 2021-02-12 ENCOUNTER — Inpatient Hospital Stay: Payer: Self-pay | Attending: Hematology

## 2021-02-12 ENCOUNTER — Other Ambulatory Visit: Payer: Self-pay

## 2021-02-12 DIAGNOSIS — D0511 Intraductal carcinoma in situ of right breast: Secondary | ICD-10-CM | POA: Insufficient documentation

## 2021-02-12 DIAGNOSIS — Z170 Estrogen receptor positive status [ER+]: Secondary | ICD-10-CM | POA: Insufficient documentation

## 2021-02-12 LAB — CBC WITH DIFFERENTIAL (CANCER CENTER ONLY)
Abs Immature Granulocytes: 0.01 10*3/uL (ref 0.00–0.07)
Basophils Absolute: 0 10*3/uL (ref 0.0–0.1)
Basophils Relative: 0 %
Eosinophils Absolute: 0.1 10*3/uL (ref 0.0–0.5)
Eosinophils Relative: 1 %
HCT: 38.3 % (ref 36.0–46.0)
Hemoglobin: 13.2 g/dL (ref 12.0–15.0)
Immature Granulocytes: 0 %
Lymphocytes Relative: 38 %
Lymphs Abs: 2.9 10*3/uL (ref 0.7–4.0)
MCH: 30.6 pg (ref 26.0–34.0)
MCHC: 34.5 g/dL (ref 30.0–36.0)
MCV: 88.9 fL (ref 80.0–100.0)
Monocytes Absolute: 0.7 10*3/uL (ref 0.1–1.0)
Monocytes Relative: 9 %
Neutro Abs: 3.9 10*3/uL (ref 1.7–7.7)
Neutrophils Relative %: 52 %
Platelet Count: 179 10*3/uL (ref 150–400)
RBC: 4.31 MIL/uL (ref 3.87–5.11)
RDW: 12.8 % (ref 11.5–15.5)
WBC Count: 7.6 10*3/uL (ref 4.0–10.5)
nRBC: 0 % (ref 0.0–0.2)

## 2021-02-12 LAB — CMP (CANCER CENTER ONLY)
ALT: 31 U/L (ref 0–44)
AST: 32 U/L (ref 15–41)
Albumin: 4.5 g/dL (ref 3.5–5.0)
Alkaline Phosphatase: 42 U/L (ref 38–126)
Anion gap: 7 (ref 5–15)
BUN: 15 mg/dL (ref 8–23)
CO2: 31 mmol/L (ref 22–32)
Calcium: 10 mg/dL (ref 8.9–10.3)
Chloride: 99 mmol/L (ref 98–111)
Creatinine: 0.68 mg/dL (ref 0.44–1.00)
GFR, Estimated: >60 mL/min (ref >60)
Glucose, Bld: 126 mg/dL — ABNORMAL HIGH (ref 70–99)
Potassium: 4.4 mmol/L (ref 3.5–5.1)
Sodium: 137 mmol/L (ref 135–145)
Total Bilirubin: 0.6 mg/dL (ref 0.3–1.2)
Total Protein: 7.3 g/dL (ref 6.5–8.1)

## 2021-02-13 ENCOUNTER — Other Ambulatory Visit: Payer: Self-pay

## 2021-02-13 ENCOUNTER — Emergency Department (HOSPITAL_COMMUNITY): Payer: No Typology Code available for payment source

## 2021-02-13 ENCOUNTER — Ambulatory Visit: Admission: RE | Admit: 2021-02-13 | Payer: Self-pay | Source: Ambulatory Visit

## 2021-02-13 ENCOUNTER — Ambulatory Visit: Payer: Self-pay | Admitting: *Deleted

## 2021-02-13 ENCOUNTER — Encounter (HOSPITAL_COMMUNITY): Payer: Self-pay | Admitting: Emergency Medicine

## 2021-02-13 ENCOUNTER — Observation Stay (HOSPITAL_COMMUNITY)
Admission: EM | Admit: 2021-02-13 | Discharge: 2021-02-14 | Disposition: A | Discharge status: Home / Self Care | Payer: Self-pay | Attending: Emergency Medicine | Admitting: Emergency Medicine

## 2021-02-13 DIAGNOSIS — I160 Hypertensive urgency: Secondary | ICD-10-CM

## 2021-02-13 DIAGNOSIS — R42 Dizziness and giddiness: Secondary | ICD-10-CM

## 2021-02-13 DIAGNOSIS — E119 Type 2 diabetes mellitus without complications: Secondary | ICD-10-CM | POA: Insufficient documentation

## 2021-02-13 DIAGNOSIS — E785 Hyperlipidemia, unspecified: Secondary | ICD-10-CM | POA: Diagnosis present

## 2021-02-13 DIAGNOSIS — I10 Essential (primary) hypertension: Secondary | ICD-10-CM | POA: Diagnosis present

## 2021-02-13 DIAGNOSIS — Z7984 Long term (current) use of oral hypoglycemic drugs: Secondary | ICD-10-CM | POA: Insufficient documentation

## 2021-02-13 DIAGNOSIS — R0789 Other chest pain: Secondary | ICD-10-CM

## 2021-02-13 DIAGNOSIS — R3589 Other polyuria: Secondary | ICD-10-CM | POA: Insufficient documentation

## 2021-02-13 DIAGNOSIS — R0602 Shortness of breath: Secondary | ICD-10-CM | POA: Insufficient documentation

## 2021-02-13 DIAGNOSIS — Z853 Personal history of malignant neoplasm of breast: Secondary | ICD-10-CM | POA: Insufficient documentation

## 2021-02-13 DIAGNOSIS — R519 Headache, unspecified: Secondary | ICD-10-CM | POA: Insufficient documentation

## 2021-02-13 DIAGNOSIS — Z7982 Long term (current) use of aspirin: Secondary | ICD-10-CM | POA: Insufficient documentation

## 2021-02-13 DIAGNOSIS — D0511 Intraductal carcinoma in situ of right breast: Secondary | ICD-10-CM | POA: Diagnosis present

## 2021-02-13 DIAGNOSIS — E118 Type 2 diabetes mellitus with unspecified complications: Secondary | ICD-10-CM

## 2021-02-13 DIAGNOSIS — Z20822 Contact with and (suspected) exposure to covid-19: Secondary | ICD-10-CM | POA: Insufficient documentation

## 2021-02-13 DIAGNOSIS — R079 Chest pain, unspecified: Secondary | ICD-10-CM

## 2021-02-13 DIAGNOSIS — R55 Syncope and collapse: Principal | ICD-10-CM

## 2021-02-13 DIAGNOSIS — Z79899 Other long term (current) drug therapy: Secondary | ICD-10-CM | POA: Insufficient documentation

## 2021-02-13 LAB — CBC WITH DIFFERENTIAL/PLATELET
Abs Immature Granulocytes: 0.02 10*3/uL (ref 0.00–0.07)
Basophils Absolute: 0 10*3/uL (ref 0.0–0.1)
Basophils Relative: 0 %
Eosinophils Absolute: 0 10*3/uL (ref 0.0–0.5)
Eosinophils Relative: 1 %
HCT: 41.2 % (ref 36.0–46.0)
Hemoglobin: 14.6 g/dL (ref 12.0–15.0)
Immature Granulocytes: 0 %
Lymphocytes Relative: 25 %
Lymphs Abs: 1.7 10*3/uL (ref 0.7–4.0)
MCH: 31.5 pg (ref 26.0–34.0)
MCHC: 35.4 g/dL (ref 30.0–36.0)
MCV: 89 fL (ref 80.0–100.0)
Monocytes Absolute: 0.6 10*3/uL (ref 0.1–1.0)
Monocytes Relative: 9 %
Neutro Abs: 4.5 10*3/uL (ref 1.7–7.7)
Neutrophils Relative %: 65 %
Platelets: 192 10*3/uL (ref 150–400)
RBC: 4.63 MIL/uL (ref 3.87–5.11)
RDW: 12.9 % (ref 11.5–15.5)
WBC: 6.8 10*3/uL (ref 4.0–10.5)
nRBC: 0 % (ref 0.0–0.2)

## 2021-02-13 LAB — I-STAT CHEM 8, ED
BUN: 17 mg/dL (ref 8–23)
Calcium, Ion: 1.11 mmol/L — ABNORMAL LOW (ref 1.15–1.40)
Chloride: 98 mmol/L (ref 98–111)
Creatinine, Ser: 0.4 mg/dL — ABNORMAL LOW (ref 0.44–1.00)
Glucose, Bld: 157 mg/dL — ABNORMAL HIGH (ref 70–99)
HCT: 44 % (ref 36.0–46.0)
Hemoglobin: 15 g/dL (ref 12.0–15.0)
Potassium: 3.7 mmol/L (ref 3.5–5.1)
Sodium: 138 mmol/L (ref 135–145)
TCO2: 27 mmol/L (ref 22–32)

## 2021-02-13 LAB — URINALYSIS, ROUTINE W REFLEX MICROSCOPIC
Bacteria, UA: NONE SEEN
Bilirubin Urine: NEGATIVE
Glucose, UA: >=500 mg/dL — AB
Ketones, ur: NEGATIVE mg/dL
Leukocytes,Ua: NEGATIVE
Nitrite: NEGATIVE
Protein, ur: NEGATIVE mg/dL
Specific Gravity, Urine: 1.002 — ABNORMAL LOW (ref 1.005–1.030)
pH: 8 (ref 5.0–8.0)

## 2021-02-13 LAB — COMPREHENSIVE METABOLIC PANEL
ALT: 52 U/L — ABNORMAL HIGH (ref 0–44)
AST: 52 U/L — ABNORMAL HIGH (ref 15–41)
Albumin: 4.6 g/dL (ref 3.5–5.0)
Alkaline Phosphatase: 46 U/L (ref 38–126)
Anion gap: 11 (ref 5–15)
BUN: 16 mg/dL (ref 8–23)
CO2: 26 mmol/L (ref 22–32)
Calcium: 9.7 mg/dL (ref 8.9–10.3)
Chloride: 99 mmol/L (ref 98–111)
Creatinine, Ser: 0.61 mg/dL (ref 0.44–1.00)
GFR, Estimated: >60 mL/min (ref >60)
Glucose, Bld: 158 mg/dL — ABNORMAL HIGH (ref 70–99)
Potassium: 3.8 mmol/L (ref 3.5–5.1)
Sodium: 136 mmol/L (ref 135–145)
Total Bilirubin: 0.6 mg/dL (ref 0.3–1.2)
Total Protein: 7.5 g/dL (ref 6.5–8.1)

## 2021-02-13 LAB — D-DIMER, QUANTITATIVE: D-Dimer, Quant: 0.34 ug/mL-FEU (ref 0.00–0.50)

## 2021-02-13 LAB — RESP PANEL BY RT-PCR (FLU A&B, COVID) ARPGX2
Influenza A by PCR: NEGATIVE
Influenza B by PCR: NEGATIVE
SARS Coronavirus 2 by RT PCR: NEGATIVE

## 2021-02-13 LAB — TROPONIN I (HIGH SENSITIVITY)
Troponin I (High Sensitivity): 12 ng/L (ref <18)
Troponin I (High Sensitivity): 11 ng/L (ref <18)

## 2021-02-13 LAB — BETA-HYDROXYBUTYRIC ACID: Beta-Hydroxybutyric Acid: 0.23 mmol/L (ref 0.05–0.27)

## 2021-02-13 MED ORDER — SODIUM CHLORIDE 0.9 % IV SOLN: 250.0000 mL | INTRAVENOUS | Status: DC | PRN

## 2021-02-13 MED ORDER — AMLODIPINE BESYLATE 5 MG PO TABS
5.0000 mg | ORAL_TABLET | Freq: Every day | ORAL | Status: DC
  Administered 2021-02-14: 5 mg via ORAL
  Filled 2021-02-13: qty 1

## 2021-02-13 MED ORDER — ACETAMINOPHEN 650 MG RE SUPP: 650.0000 mg | Freq: Four times a day (QID) | RECTAL | Status: DC | PRN

## 2021-02-13 MED ORDER — HYDROCHLOROTHIAZIDE 12.5 MG PO TABS
12.5000 mg | ORAL_TABLET | Freq: Every day | ORAL | Status: DC
  Administered 2021-02-14: 12.5 mg via ORAL
  Filled 2021-02-13: qty 1

## 2021-02-13 MED ORDER — LISINOPRIL-HYDROCHLOROTHIAZIDE 20-12.5 MG PO TABS: 1.0000 | ORAL_TABLET | Freq: Every day | ORAL | Status: DC

## 2021-02-13 MED ORDER — ATORVASTATIN CALCIUM 10 MG PO TABS
20.0000 mg | ORAL_TABLET | Freq: Every day | ORAL | Status: DC
  Administered 2021-02-13: 20 mg via ORAL
  Filled 2021-02-13: qty 2

## 2021-02-13 MED ORDER — HYDRALAZINE HCL 20 MG/ML IJ SOLN: 5.0000 mg | Freq: Three times a day (TID) | INTRAMUSCULAR | Status: DC | PRN

## 2021-02-13 MED ORDER — ENOXAPARIN SODIUM 40 MG/0.4ML IJ SOSY
40.0000 mg | PREFILLED_SYRINGE | INTRAMUSCULAR | Status: DC
  Administered 2021-02-13: 40 mg via SUBCUTANEOUS
  Filled 2021-02-13: qty 0.4

## 2021-02-13 MED ORDER — LISINOPRIL 20 MG PO TABS
20.0000 mg | ORAL_TABLET | Freq: Every day | ORAL | Status: DC
  Administered 2021-02-14: 20 mg via ORAL
  Filled 2021-02-13: qty 1

## 2021-02-13 MED ORDER — ASPIRIN EC 81 MG PO TBEC
81.0000 mg | DELAYED_RELEASE_TABLET | Freq: Every day | ORAL | Status: DC
  Administered 2021-02-14: 81 mg via ORAL
  Filled 2021-02-13: qty 1

## 2021-02-13 MED ORDER — INSULIN ASPART 100 UNIT/ML IJ SOLN
0.0000 [IU] | Freq: Three times a day (TID) | INTRAMUSCULAR | Status: DC
  Administered 2021-02-14 (×2): 1 [IU] via SUBCUTANEOUS

## 2021-02-13 MED ORDER — SODIUM CHLORIDE 0.9% FLUSH: 3.0000 mL | INTRAVENOUS | Status: DC | PRN

## 2021-02-13 MED ORDER — ACETAMINOPHEN 325 MG PO TABS: 650.0000 mg | ORAL_TABLET | Freq: Four times a day (QID) | ORAL | Status: DC | PRN

## 2021-02-13 MED ORDER — SODIUM CHLORIDE 0.9% FLUSH
3.0000 mL | Freq: Two times a day (BID) | INTRAVENOUS | Status: DC
  Administered 2021-02-14: 3 mL via INTRAVENOUS

## 2021-02-13 NOTE — H&P (Signed)
History and Physical    Patient: Gail Manning WUJ:[id number] DOB: 1945-05-15 DOA: 02/13/2021 DOS: the patient was seen and examined on 02/13/2021 PCP: Patient, No Pcp Per (Inactive)  Patient coming from:  womens clinic  - lives with her husband    Chief Complaint: chest pain and near syncopal episode. Translating service used for H&P  HPI: Gail Manning is a 76 y.o. female with medical history significant of T2DM, breast cancer, HTN, HLD who presented to [facility] from another doctor clinic. She all of a sudden started to feel dizzy. She felt like the world was spinning. She started to feel bad and sat down. Her blood pressure was checked and it was very high. She also had a bad headache and shortness of breath and mild chest pain. EMS was called. Chest pain located on the left side. Pain described as pressure/pain and worse with breathing. Pain did not radiate. She had no diaphoresis, no N/V. Pain has resolved.  Per oncology note bp was 200/156 manually. Reported jaw pain, but has history of myofascial pain dating back to last year and denied any radiation to her jaw.   She has a history of heart disease in her mother and brother.  She does not smoke or drink.   She has otherwise been feeling okay. No fever/chills, no vision changes, has a headache right now, no cough/shortness of breath, no abdominal pain, No N/V/D, no urinary symptoms, no leg swelling.   ER Course:  vitals: afebrile, bp: 167/84, HR: 69, RR: 16, oxygen: 100%RA Pertinent labs: none CTH: no acute finding    Review of Systems: As mentioned in the history of present illness. All other systems reviewed and are negative. 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, right 01/20/2015   Cataracts, bilateral    Diabetes mellitus without complication ([place])    Hyperlipidemia    Hypertension    Personal history of radiation therapy  2017   Right Breast Cancer   Retinitis pigmentosa    Urinary and fecal incontinence    urinary only   Past Surgical History:  Procedure Laterality Date   BREAST LUMPECTOMY Right 02/2015   BREAST LUMPECTOMY WITH NEEDLE LOCALIZATION Right 02/10/2015   Procedure: RIGHT BREAST LUMPECTOMY WITH NEEDLE LOCALIZATION;  Surgeon: Excell Seltzer, MD;  Location: [place];  Service: General;  Laterality: Right;   CESAREAN SECTION     x3   Social History:  reports that she has never smoked. She has never used smokeless tobacco. She reports that she does not drink alcohol and does not use drugs.  Allergies  Allergen Reactions   Albuterol Sulfate     REACTION: Questionable Unknown reaction   Scopolamine Other (See Comments)    n/a    Family History  Problem Relation Age of Onset   Diabetes Mother    Hypertension Mother    Rheum arthritis Mother    Cancer Father        colon   Diabetes Father    Hypertension Father    Breast cancer Sister 20   Hypertension Sister    Diabetes Sister    Diabetes Brother     Prior to Admission medications   Medication Sig Start Date End Date Taking? Authorizing Provider  aspirin EC 81 MG tablet Take 81 mg by mouth daily.    [provider]  calcium & magnesium carbonates (MYLANTA)  937-169 MG tablet Take 3 tablets by mouth daily.    [provider]  Calcium Carb-Cholecalciferol (CALCIUM 1000 + D PO) Take 2 tablets by mouth daily.    [provider]  glimepiride (AMARYL) 1 MG tablet Take 1 mg by mouth daily. 01/29/21   [provider]  hydrOXYzine (ATARAX) 50 MG tablet Take 50 mg by mouth at bedtime as needed for sleep. 02/03/21   [provider]  Hypromellose (SYSTANE OVERNIGHT THERAPY OP) Place 1 drop into both eyes 3 (three) times daily.    [provider]  lisinopril-hydrochlorothiazide (ZESTORETIC) 20-12.5 MG tablet Take 1 tablet by mouth daily. 02/03/21   [provider]   lovastatin (MEVACOR) 20 MG tablet Take 20 mg by mouth at bedtime.    [provider]  omega-3 acid ethyl esters (LOVAZA) 1 g capsule Take 1 g by mouth 2 (two) times daily.    [provider]  tolterodine (DETROL LA) 4 MG 24 hr capsule Take 4 mg by mouth daily. 12/22/20   [provider]  vitamin E 1000 UNIT capsule Take 1,000 Units by mouth daily.    [provider]  VITAMIN E SKIN OIL Apply 1 application topically daily.    [provider]    Physical Exam: Vitals:   02/13/21 1600 02/13/21 1700 02/13/21 1730 02/13/21 1800  BP: (!) 186/66 (!) 145/63 (!) 146/62 (!) 171/65  Pulse: (!) 58 (!) 56 (!) 58 (!) 58  Resp: 15 17  13   Temp:      TempSrc:      SpO2: 100% 97% 98% 99%  Weight:      Height:       General:  Appears calm and comfortable and is in NAD Eyes:  PERRL, EOMI, normal lids, iris. Is legally blind ENT:  grossly normal hearing, lips & tongue, mmm; appropriate dentition. TM pearly with light reflex bilaterally  Neck:  no LAD, masses or thyromegaly; no carotid bruits Cardiovascular:  RRR, no m/r/g. No LE edema.  Respiratory:   CTA bilaterally with no wheezes/rales/rhonchi.  Normal respiratory effort. Abdomen:  soft, NT, ND, NABS Back:   normal alignment, no CVAT Skin:  no rash or induration seen on limited exam Musculoskeletal:  grossly normal tone BUE/BLE, good ROM, no bony abnormality Lower extremity:  No LE edema.  Limited foot exam with no ulcerations.  2+ distal pulses. Psychiatric:  grossly normal mood and affect, speech fluent and appropriate, AOx3 Neurologic:  CN 2-12 grossly intact, moves all extremities in coordinated fashion, sensation intact   Radiological Exams on Admission: Independently reviewed - see discussion in A/P where applicable  CT Head Wo Contrast  Result Date: 02/13/2021 CLINICAL DATA:  None EXAM: CT HEAD WITHOUT CONTRAST TECHNIQUE: Contiguous axial images were obtained from the base of the skull  through the vertex without intravenous contrast. RADIATION DOSE REDUCTION: This exam was performed according to the departmental dose-optimization program which includes automated exposure control, adjustment of the mA and/or kV according to patient size and/or use of iterative reconstruction technique. COMPARISON:  None. FINDINGS: Brain: Chronic white matter ischemic change. No evidence of acute infarction, hemorrhage, hydrocephalus, extra-axial collection or mass lesion/mass effect. Vascular: No hyperdense vessel or unexpected calcification. Skull: Normal. Negative for fracture or focal lesion. Sinuses/Orbits: No acute finding. Other: None. IMPRESSION: No acute intracranial abnormality. Electronically Signed   By: Yetta Glassman M.D.   On: 02/13/2021 12:03    EKG: Independently reviewed.  NSR with rate 69; nonspecific ST changes with no  evidence of acute ischemia. New T wave inversions.    Labs on Admission: I have personally reviewed the available labs and imaging studies at the time of the admission.  Pertinent labs:   None    Assessment and Plan: * Chest pain with near syncope  76 year old female with chest pain, dizziness and shortness of breath relieved some with ASA and risk factors for ACS include HTN, HLD, T2DM, age and family history  Place in observation on telemetry -troponin wnl x 2  -chest pain resolved -had bp of 200/156 at time of chest pain, ? HTN urgency, but bp has been reasonably well controlled here with no intervention.  -cardiology has been consulted with risk factor and clinical history and family request  -continue ASA daily -continue lipitor -lipid panel pending for AM   Essential hypertension- (present on admission) Slightly elevated, has had her medication Continue home medication IV parameters  Hyperlipidemia- (present on admission) Lipid panel pending for AM Continue lipitor   Controlled type 2 diabetes mellitus without complication, without long-term  current use of insulin ([place]) No a1c in chart, have ordered for tomorrow Holding metformin and glimperide SSI and accuchecks per protocol   Ductal carcinoma in situ (DCIS) of right breast- (present on admission) Diagnosed in 01/2015. S/p lumpectomy and adjuvant radiation. She declined anti-estrogen therapy  -Currently under surveillance. now with screening mammograms through [facility] program -followed by oncology outpatient        Advance Care Planning:   Code Status: Full Code   Consults: cardiology, Dr. Stanford Breed   DVT Prophylaxis: lovenox   Family Communication: husband at bedside: refugio cortez-gonzalez  Severity of Illness: The appropriate patient status for this patient is OBSERVATION. Observation status is judged to be reasonable and necessary in order to provide the required intensity of service to ensure the patient's safety. The patient's presenting symptoms, physical exam findings, and initial radiographic and laboratory data in the context of their medical condition is felt to place them at decreased risk for further clinical deterioration. Furthermore, it is anticipated that the patient will be medically stable for discharge from the hospital within 2 midnights of admission.   Author: Orma Flaming, MD 02/13/2021 6:58 PM  For on call review [email or web address].

## 2021-02-13 NOTE — ED Provider Notes (Signed)
[place] EMERGENCY DEPARTMENT Provider Note   CSN: [id number] Arrival date & time: 02/13/21  1021     History  No chief complaint on file.   Gail Manning is a 76 y.o. 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  At the time of symptom onset she was standing by the nursing station and they immediately checked her BP and it was high, but she is unable to state how high.  Also, while she was in the office she began to pee almost every 30s-88min. Patient has a 1 year history of urinary incontinence and uses adult pads however reports that this is worse.  No dysuria and unable to assess hematuria due to patient's blindness.  States her last A1c over a month ago was 7.7.  She does not check her blood sugar at home more than once a week to once a month due to the discomfort of the finger prick.  She originally reported chest pain however says that the aspirin given in the office made it better.  No history of ACS. Denies any shortness of breath but says that occasionally when she takes a big deep breath she feels winded. No history of DVT/PE.     History taking was completed with bedside interpreter.  Home Medications Prior to Admission medications   Medication Sig Start Date End Date Taking? Authorizing Provider  aspirin EC 81 MG tablet Take 81 mg by mouth daily.    [provider]  calcium & magnesium carbonates (MYLANTA) 852-778 MG tablet Take 3 tablets by mouth daily.    [provider]  Calcium Carb-Cholecalciferol (CALCIUM 1000 + D PO) Take 2 tablets by mouth daily.    [provider]  Hypromellose (SYSTANE OVERNIGHT THERAPY OP) Apply 1 drop to eye 3 (three) times daily. 1 drop in each eye TID.    [provider]  lisinopril (PRINIVIL,ZESTRIL) 20 MG tablet Take 20 mg by mouth daily.    [provider]  lovastatin (MEVACOR) 20 MG tablet Take 20 mg by mouth at bedtime.    [provider]  metFORMIN (GLUCOPHAGE) 1000 MG tablet Take 1,000 mg by mouth 2 (two) times daily with a meal. Reported on 01/25/2015    [provider]  nystatin (MYCOSTATIN/NYSTOP) powder Apply topically 3 (three) times daily. 10/27/15   Truitt Merle, MD  omega-3 acid ethyl esters (LOVAZA) 1 g capsule Take by mouth 2 (two) times daily.    [provider]  vitamin E 1000 UNIT capsule Take 1,000 Units by mouth daily.    [provider]  VITAMIN E SKIN OIL Apply topically daily.    [provider]      Allergies    Albuterol sulfate and Scopolamine    Review of Systems   Review of Systems  Neurological:  Positive for dizziness and headaches.  See HPI  Physical Exam Updated Vital Signs BP (!) 167/84    Pulse 69    Temp 98.2 F (36.8 C) (Oral)    Resp 16    SpO2 100%  Physical Exam Vitals and nursing note reviewed.  Constitutional:      General: She is not in acute distress.    Appearance: Normal appearance. She is not ill-appearing.  HENT:     Head: Normocephalic and atraumatic.  Eyes:     General: No scleral icterus.    Conjunctiva/sclera: Conjunctivae normal.  Comments: Patient is blind, pupils nonresponsive to light  Cardiovascular:     Rate and Rhythm: Normal rate and regular rhythm.  Pulmonary:     Effort: Pulmonary effort is normal. No respiratory distress.     Breath sounds: No wheezing.  Abdominal:     Tenderness: There is no abdominal tenderness.  Musculoskeletal:     Right lower leg: No edema.     Left lower leg: No edema.  Skin:    General: Skin is warm and dry.     Findings: No rash.  Neurological:     Mental Status: She is alert.     Motor: No weakness.     Comments: Patient unable to participate in finger-nose however heel shin within normal limits.  Unable to test EOMs or cranial nerve II either.  No other abnormalities   Psychiatric:         Mood and Affect: Mood normal.    ED Results / Procedures / Treatments   Labs (all labs ordered are listed, but only abnormal results are displayed) Labs Reviewed  URINALYSIS, ROUTINE W REFLEX MICROSCOPIC - Abnormal; Notable for the following components:      Result Value   Color, Urine STRAW (*)    Specific Gravity, Urine 1.002 (*)    Glucose, UA >=500 (*)    Hgb urine dipstick SMALL (*)    All other components within normal limits  I-STAT CHEM 8, ED - Abnormal; Notable for the following components:   Creatinine, Ser 0.40 (*)    Glucose, Bld 157 (*)    Calcium, Ion 1.11 (*)    All other components within normal limits  RESP PANEL BY RT-PCR (FLU A&B, COVID) ARPGX2  CBC WITH DIFFERENTIAL/PLATELET  BETA-HYDROXYBUTYRIC ACID  D-DIMER, QUANTITATIVE  COMPREHENSIVE METABOLIC PANEL  CBG MONITORING, ED  TROPONIN I (HIGH SENSITIVITY)  TROPONIN I (HIGH SENSITIVITY)    EKG None  EKG not crossing over in the system.  Reviewed by Dr. Dina Rich who compared patient's EKG to 1 year.  Patient has new T wave inversions but no clear ST elevation or depression.  Radiology CT Head Wo Contrast  Result Date: 02/13/2021 CLINICAL DATA:  None EXAM: CT HEAD WITHOUT CONTRAST TECHNIQUE: Contiguous axial images were obtained from the base of the skull through the vertex without intravenous contrast. RADIATION DOSE REDUCTION: This exam was performed according to the departmental dose-optimization program which includes automated exposure control, adjustment of the mA and/or kV according to patient size and/or use of iterative reconstruction technique. COMPARISON:  None. FINDINGS: Brain: Chronic white matter ischemic change. No evidence of acute infarction, hemorrhage, hydrocephalus, extra-axial collection or mass lesion/mass effect. Vascular: No hyperdense vessel or unexpected calcification. Skull: Normal. Negative for fracture or focal lesion. Sinuses/Orbits: No acute finding. Other: None. IMPRESSION: No acute  intracranial abnormality. Electronically Signed   By: Yetta Glassman M.D.   On: 02/13/2021 12:03    Procedures Procedures  Patient remained in sinus rhythm throughout her stay.  Medications Ordered in ED Medications - No data to display  ED Course/ Medical Decision Making/ A&P                           Medical Decision Making Amount and/or Complexity of Data Reviewed Labs: ordered. Radiology: ordered.   76 year old female presented today with acute onset dizziness and polyuria.  Differential includes but is not limited to: ICH, CVA, DKA, PE, ACS.   Co morbidities that complicate the patient evaluation include:  Blindness, diabetes, hypertension, HLD   Workup:  Lab Tests: Originally patient did not want lab work done.  Reported that she had labs yesterday and that we can use those because she has a hard stick and has sensitive veins.  Yesterday she only had a CBC and CMP reviewed, I was able to see this through care everywhere.  With the interpreter, I discussed with the patient and her husband the importance of lab work.  We discussed the desire to rule out any heart damage, clotting, diabetes complication.  At the end of our conversation, patient agreed to do lab work.  I Ordered, and personally interpreted labs.   The pertinent results include:  -Negative D-dimer -No signs of UTI on urinalysis, >500 glucose in urine -Troponin negative x1, second pending  2. Imaging Studies ordered:  I ordered imaging studies including CT head. I independently visualized and interpreted these and agree with the radiologist's results of no ICH, lesion or obvious CVA.  3. Cardiac Monitoring:  The patient was maintained on a cardiac monitor.  I personally viewed and interpreted the cardiac monitored which showed normal sinus rhythm with a normal rate  4. Test Considered:  -CTA, however patient is low Wells PE risk.  D-dimer was ordered instead. - 5. Consultations Obtained:  None at this  time, however patient is with heart score 6, will likely need to consult with cardiology, potentially inpatient.  Treatment:  Medications: Patient denied the need for any pain medications, reporting that her headache, mild chest pain and dizziness have mostly resolved.  Dispo:  Problem List / ED Course:  Acute onset chest pain, dizziness and headache.   Dispostion:  After the interventions noted above, I reevaluated the patient and found that they have : Improved, denying any symptoms at this time.  After consideration of the diagnostic results and the patients response to treatment, I feel that the patent would benefit from admission for further cardiac work-up due to patient's elevated heart score. She and her husband are agreeable to this plan.  In regard to patient's polyuria, I suspect this may be a complication of her diabetes.  She reported that she only takes 1 blood sugar reading between 1 week and 1 month.  Reports that her last A1c over a month ago was 7.7.  Says that she does not take her blood sugar because her fingers cannot tolerate it.  She also reports that she is out of her glimepiride for the past 2 weeks that she takes for urinary incontinence.  Final Clinical Impression(s) / ED Diagnoses Final diagnoses:  Polyuria    Rx / DC Orders Patient signed out to PA Alyse Low at shift change.  She will follow-up on second troponin.  Regardless of result, patient will need admission for further cardiac work-up.  She and her husband are agreeable to this plan.     Rhae Hammock, PA-C 02/13/21 1518    Lorelle Gibbs, DO 02/15/21 1025
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
## 2021-02-13 NOTE — Assessment & Plan Note (Addendum)
76 year old female with chest pain, dizziness and shortness of breath relieved some with ASA and risk factors for ACS include HTN, HLD, T2DM, age and family history  Place in observation on telemetry -troponin wnl x 2  -chest pain resolved -had bp of 200/156 at time of chest pain, ? HTN urgency, but bp has been reasonably well controlled here with no intervention.  -cardiology has been consulted with risk factor and clinical history and family request  -continue ASA daily -continue lipitor -lipid panel pending for AM

## 2021-02-13 NOTE — Progress Notes (Signed)
Ms. Gail Manning is a 76 y.o. female who presents to [facility] clinic today with complaint of dizziness on arrival. Patient taken in exam room immediately. Blood pressure 200/156 manually. Patient complained of jaw pain and swelling x 3 days. Patient complained of left chest pain and tightness around chest x one day. Respirations 22, O2 sats 100% on room air, pulse ranged between 72-74. Patients skin warm to touch and mild SOB observed. Patient has history of hypertension and took her lisinopril at 0730 this morning. Called 911 and EMS arrived a few minutes later. Patient transported by EMS to [facility] for evaluation.    No breast exam or mammogram completed today due to patients above symptoms. Patient will call to reschedule.   Loletta Parish, RN 02/13/2021 10:00 AM

## 2021-02-13 NOTE — Assessment & Plan Note (Signed)
Lipid panel pending for AM Continue lipitor

## 2021-02-13 NOTE — Assessment & Plan Note (Signed)
No a1c in chart, have ordered for tomorrow Holding metformin and glimperide SSI and accuchecks per protocol

## 2021-02-13 NOTE — Assessment & Plan Note (Signed)
Slightly elevated, has had her medication Continue home medication IV parameters

## 2021-02-13 NOTE — ED Provider Notes (Signed)
Patient care assumed at 3:30 PM.  Patient had an episode of chest pain today with a near syncopal episode pain has resolved since being in the emergency department.  Patient has past medical history of type 2 diabetes hyperlipidemia hypertension and breast cancer.  Patient has had 2 negative troponins I spoke to unassigned medicine hospitalist Dr. Eliberto Ivory who will see for admission   Sidney Ace 02/13/21 1743    Regan Lemming, MD 02/13/21 2317

## 2021-02-13 NOTE — ED Triage Notes (Signed)
Pt from [facility] where she presented for a routine mammogram. Pt endorsed chest pain and dizziness on arrival to her appointment. 324 ASA given by that facility. Endorsed resolution of chest pain but sustained dizziness for EMS and also has urinary frequency, urinating three times in the last thirty mins. Pt is blind. Hypertensive: 216/90 with abnormal EKG.

## 2021-02-13 NOTE — Assessment & Plan Note (Signed)
Diagnosed in 01/2015. S/p lumpectomy and adjuvant radiation. She declined anti-estrogen therapy  -Currently undersurveillance.now with screening mammograms through [facility] program -followed by oncology outpatient

## 2021-02-14 ENCOUNTER — Observation Stay (HOSPITAL_BASED_OUTPATIENT_CLINIC_OR_DEPARTMENT_OTHER): Payer: No Typology Code available for payment source

## 2021-02-14 ENCOUNTER — Inpatient Hospital Stay: Payer: No Typology Code available for payment source | Admitting: Nurse Practitioner

## 2021-02-14 ENCOUNTER — Observation Stay (HOSPITAL_COMMUNITY): Payer: No Typology Code available for payment source

## 2021-02-14 DIAGNOSIS — R079 Chest pain, unspecified: Secondary | ICD-10-CM

## 2021-02-14 DIAGNOSIS — R071 Chest pain on breathing: Secondary | ICD-10-CM

## 2021-02-14 LAB — ECHOCARDIOGRAM COMPLETE
AR max vel: 1.7 cm2
AV Peak grad: 8.1 mmHg
Ao pk vel: 1.42 m/s
Area-P 1/2: 3.51 cm2
Calc EF: 63.7 %
Height: 62 in
S' Lateral: 2.9 cm
Single Plane A2C EF: 65.7 %
Single Plane A4C EF: 61.2 %
Weight: 2240 oz

## 2021-02-14 LAB — BASIC METABOLIC PANEL
Anion gap: 14 (ref 5–15)
BUN: 14 mg/dL (ref 8–23)
CO2: 23 mmol/L (ref 22–32)
Calcium: 9.5 mg/dL (ref 8.9–10.3)
Chloride: 97 mmol/L — ABNORMAL LOW (ref 98–111)
Creatinine, Ser: 0.6 mg/dL (ref 0.44–1.00)
GFR, Estimated: >60 mL/min (ref >60)
Glucose, Bld: 134 mg/dL — ABNORMAL HIGH (ref 70–99)
Potassium: 3.3 mmol/L — ABNORMAL LOW (ref 3.5–5.1)
Sodium: 134 mmol/L — ABNORMAL LOW (ref 135–145)

## 2021-02-14 LAB — CBC
HCT: 40.7 % (ref 36.0–46.0)
Hemoglobin: 14.2 g/dL (ref 12.0–15.0)
MCH: 31.3 pg (ref 26.0–34.0)
MCHC: 34.9 g/dL (ref 30.0–36.0)
MCV: 89.8 fL (ref 80.0–100.0)
Platelets: 187 10*3/uL (ref 150–400)
RBC: 4.53 MIL/uL (ref 3.87–5.11)
RDW: 13 % (ref 11.5–15.5)
WBC: 6.8 10*3/uL (ref 4.0–10.5)
nRBC: 0 % (ref 0.0–0.2)

## 2021-02-14 LAB — LIPID PANEL
Cholesterol: 135 mg/dL (ref 0–200)
HDL: 54 mg/dL (ref >40)
LDL Cholesterol: 64 mg/dL (ref 0–99)
Total CHOL/HDL Ratio: 2.5 RATIO
Triglycerides: 85 mg/dL (ref <150)
VLDL: 17 mg/dL (ref 0–40)

## 2021-02-14 LAB — CBG MONITORING, ED
Glucose-Capillary: 121 mg/dL — ABNORMAL HIGH (ref 70–99)
Glucose-Capillary: 143 mg/dL — ABNORMAL HIGH (ref 70–99)

## 2021-02-14 LAB — HEMOGLOBIN A1C
Hgb A1c MFr Bld: 7.4 % — ABNORMAL HIGH (ref 4.8–5.6)
Mean Plasma Glucose: 165.68 mg/dL

## 2021-02-14 MED ORDER — PERFLUTREN LIPID MICROSPHERE
1.0000 mL | INTRAVENOUS | Status: AC | PRN
  Administered 2021-02-14: 2 mL via INTRAVENOUS
  Filled 2021-02-14: qty 10

## 2021-02-14 MED ORDER — POTASSIUM CHLORIDE CRYS ER 20 MEQ PO TBCR
40.0000 meq | EXTENDED_RELEASE_TABLET | Freq: Once | ORAL | Status: AC
  Administered 2021-02-14: 40 meq via ORAL
  Filled 2021-02-14: qty 2

## 2021-02-14 MED ORDER — NITROGLYCERIN 0.4 MG SL SUBL: 0.8000 mg | SUBLINGUAL_TABLET | Freq: Once | SUBLINGUAL | Status: AC

## 2021-02-14 MED ORDER — NITROGLYCERIN 0.4 MG SL SUBL
SUBLINGUAL_TABLET | SUBLINGUAL | Status: AC
  Administered 2021-02-14: 0.8 mg via SUBLINGUAL
  Filled 2021-02-14: qty 2

## 2021-02-14 MED ORDER — IOHEXOL 350 MG/ML SOLN
95.0000 mL | Freq: Once | INTRAVENOUS | Status: AC | PRN
  Administered 2021-02-14: 95 mL via INTRAVENOUS

## 2021-02-14 MED ORDER — AMLODIPINE BESYLATE 5 MG PO TABS: 5.0000 mg | ORAL_TABLET | Freq: Every day | ORAL | 2 refills | Status: DC

## 2021-02-14 NOTE — ED Notes (Signed)
Echo at bedside

## 2021-02-14 NOTE — ED Notes (Signed)
IV team at bedside 

## 2021-02-14 NOTE — ED Notes (Signed)
Patient returned from CT

## 2021-02-14 NOTE — Discharge Summary (Signed)
Physician Discharge Summary  Gail Manning OIZ:[id number] DOB: [birth year] DOA: 02/13/2021  PCP: Patient, No Pcp Per (Inactive)  Admit date: 02/13/2021  Discharge date: 02/14/2021  Admitted From:Home  Disposition:  Home  Recommendations for Outpatient Follow-up:  Follow up with PCP in 1-2 weeks Please continue home meds with addition of amlodipine  Home Health:None  Equipment/Devices:None  Discharge Condition:Stable  CODE STATUS: Full  Diet recommendation: Heart Healthy/Carb Modified  Brief/Interim Summary: Per HPI: Gail Manning is a 77 y.o. female with medical history significant of T2DM, breast cancer, HTN, HLD who presented to Ed from another doctor clinic. She all of a sudden started to feel dizzy. She felt like the world was spinning. She started to feel bad and sat down. Her blood pressure was checked and it was very high. She also had a bad headache and shortness of breath and mild chest pain. EMS was called. Chest pain located on the left side. Pain described as pressure/pain and worse with breathing. Pain did not radiate. She had no diaphoresis, no N/V. Pain has resolved.   She was admitted for chest pain evaluation and was seen by Cardiology. 2D echo is as noted below and Coronary CT is demonstrating some non-obstructive CAD which is a usual finding at her age. Her chest pain is determined to be non-cardiac and she is stable for discharge. Her bp is better controlled with the addition of amlodipine. No other acute events this admission.  Discharge Diagnoses:  Principal Problem:   Chest pain with near syncope  Active Problems:   Controlled type 2 diabetes mellitus without complication, without long-term current use of insulin (HCC)   Hyperlipidemia   Essential hypertension   Ductal carcinoma in situ (DCIS) of right breast   Near syncope   Discharge Instructions  Discharge Instructions     Diet - low sodium heart healthy   Complete by: As directed     Increase activity slowly   Complete by: As directed       Allergies as of 02/14/2021       Reactions   Albuterol Sulfate    REACTION: Questionable Unknown reaction   Scopolamine Other (See Comments)   n/a        Medication List     TAKE these medications    amLODipine 5 MG tablet Commonly known as: NORVASC Take 1 tablet (5 mg total) by mouth daily. Start taking on: February 15, 2021   aspirin EC 81 MG tablet Take 81 mg by mouth daily.   calcium & magnesium carbonates 311-232 MG tablet Commonly known as: MYLANTA Take 3 tablets by mouth daily.   CALCIUM 1000 + D PO Take 2 tablets by mouth daily.   glimepiride 1 MG tablet Commonly known as: AMARYL Take 1 mg by mouth daily.   hydrOXYzine 50 MG tablet Commonly known as: ATARAX Take 50 mg by mouth at bedtime as needed for sleep.   lisinopril-hydrochlorothiazide 20-12.5 MG tablet Commonly known as: ZESTORETIC Take 1 tablet by mouth daily.   lovastatin 20 MG tablet Commonly known as: MEVACOR Take 20 mg by mouth at bedtime.   metFORMIN 500 MG tablet Commonly known as: GLUCOPHAGE Take 500 mg by mouth 2 (two) times daily with a meal.   omega-3 acid ethyl esters 1 g capsule Commonly known as: LOVAZA Take 1 g by mouth 2 (two) times daily.   SYSTANE OVERNIGHT THERAPY OP Place 1 drop into both eyes 3 (three) times daily.   tolterodine 4 MG 24  hr capsule Commonly known as: DETROL LA Take 4 mg by mouth daily.   vitamin E 1000 UNIT capsule Take 1,000 Units by mouth daily.   Vitamin E Skin Oil Apply 1 application topically daily.        Follow-up Information     Hilty, Nadean Corwin, MD. Schedule an appointment as soon as possible for a visit in 1 week(s).   Specialty: Cardiology Contact information: [address] [place] [address] [phone]                Allergies  Allergen Reactions   Albuterol Sulfate     REACTION: Questionable Unknown reaction   Scopolamine Other (See  Comments)    n/a    Consultations: Cardiology   Procedures/Studies: CT Head Wo Contrast  Result Date: 02/13/2021 CLINICAL DATA:  None EXAM: CT HEAD WITHOUT CONTRAST TECHNIQUE: Contiguous axial images were obtained from the base of the skull through the vertex without intravenous contrast. RADIATION DOSE REDUCTION: This exam was performed according to the departmental dose-optimization program which includes automated exposure control, adjustment of the mA and/or kV according to patient size and/or use of iterative reconstruction technique. COMPARISON:  None. FINDINGS: Brain: Chronic white matter ischemic change. No evidence of acute infarction, hemorrhage, hydrocephalus, extra-axial collection or mass lesion/mass effect. Vascular: No hyperdense vessel or unexpected calcification. Skull: Normal. Negative for fracture or focal lesion. Sinuses/Orbits: No acute finding. Other: None. IMPRESSION: No acute intracranial abnormality. Electronically Signed   By: Yetta Glassman M.D.   On: 02/13/2021 12:03   CT CORONARY MORPH W/CTA COR W/SCORE W/CA W/CM &/OR WO/CM  Addendum Date: 02/14/2021   ADDENDUM REPORT: 02/14/2021 14:04 CLINICAL DATA:  44F with chest pain EXAM: Cardiac/Coronary CTA TECHNIQUE: The patient was scanned on a [employer]. FINDINGS: A 100 kV prospective scan was triggered in the descending thoracic aorta at 111 HU's. Axial non-contrast 3 mm slices were carried out through the heart. The data set was analyzed on a dedicated work station and scored using the [employer]. Gantry rotation speed was 250 msecs and collimation was .6 mm. 0.8 mg of sl NTG was given. The 3D data set was reconstructed in 5% intervals of the 35-75 % of the R-R cycle. Phases were analyzed on a dedicated work station using MPR, MIP and VRT modes. The patient received 80 cc of contrast. Coronary Arteries:  Normal coronary origin.  Right dominance. RCA is a large dominant artery that gives rise to PDA and PLA.  There is noncalcified plaque in proximal RCA causing 0-24% stenosis Left main is a large artery that gives rise to LAD and LCX arteries. Noncalcified plaque in left main causes 0-24% stenosis LAD is a large vessel. Calcified plaque in mid LAD causes 0-24% stenosis LCX is a non-dominant artery.  There is no plaque. Other findings: Left Ventricle: Normal size Left Atrium: Normal size Pulmonary Veins: Normal configuration Right Ventricle: Normal size Right Atrium: Normal size Cardiac valves: No calcifications Thoracic aorta: Normal size Pulmonary Arteries: Normal size Systemic Veins: Normal drainage Pericardium: Normal thickness IMPRESSION: 1. Coronary calcium score of 8. This was 49th percentile for age and sex matched control. 2. Normal coronary origin with right dominance. 3. 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 Minimal non-obstructive CAD (0-24%). Consider non-atherosclerotic causes of chest pain. Consider preventive therapy and risk factor modification. Electronically Signed   By: Oswaldo Milian M.D.   On: 02/14/2021  14:04   Result Date: 02/14/2021 EXAM: OVER-READ INTERPRETATION  CT CHEST The following report is an over-read performed by radiologist Dr. Vinnie Langton of [facility], [place] on 02/14/2021. This over-read does not include interpretation of cardiac or coronary anatomy or pathology. The coronary calcium score/coronary CTA interpretation by the cardiologist is attached. COMPARISON:  None. FINDINGS: Atherosclerotic calcifications in the thoracic aorta. Within the visualized portions of the thorax there are no suspicious appearing pulmonary nodules or masses, there is no acute consolidative airspace disease, no pleural effusions, no pneumothorax and no lymphadenopathy. Visualized portions of the upper abdomen are unremarkable. There are no aggressive appearing lytic or blastic  lesions noted in the visualized portions of the skeleton. IMPRESSION: 1.  Aortic Atherosclerosis (ICD10-I70.0). Electronically Signed: By: Vinnie Langton M.D. On: 02/14/2021 13:12   ECHOCARDIOGRAM COMPLETE  Result Date: 02/14/2021    ECHOCARDIOGRAM REPORT   Patient Name:   Gail Manning Date of Exam: 02/14/2021 Medical Rec #:  [id number]               Height:       62.0 in Accession #:    [id number]              Weight:       140.0 lb Date of Birth:  06/06/1945               BSA:          1.643 m Patient Age:    29 years                BP:           112/49 mmHg Patient Gender: F                       HR:           65 bpm. Exam Location:  Inpatient Procedure: 2D Echo, Cardiac Doppler, Color Doppler and Intracardiac            Opacification Agent Indications:    Chest pain  History:        Patient has no prior history of Echocardiogram examinations.                 Risk Factors:Hypertension and Diabetes.  Sonographer:    Jyl Heinz Referring Phys: Conner  1. Left ventricular ejection fraction, by estimation, is 60 to 65%. The left ventricle has normal function. The left ventricle has no regional wall motion abnormalities. Left ventricular diastolic parameters are consistent with Grade I diastolic dysfunction (impaired relaxation).  2. Right ventricular systolic function is normal. The right ventricular size is normal.  3. The mitral valve is grossly normal. No evidence of mitral valve regurgitation.  4. The aortic valve was not well visualized. Aortic valve regurgitation is trivial.  5. The inferior vena cava is normal in size with greater than 50% respiratory variability, suggesting right atrial pressure of 3 mmHg. Comparison(s): No prior Echocardiogram. FINDINGS  Left Ventricle: Left ventricular ejection fraction, by estimation, is 60 to 65%. The left ventricle has normal function. The left ventricle has no regional wall motion abnormalities. The left ventricular internal  cavity size was normal in size. There is  no left ventricular hypertrophy. Left ventricular diastolic parameters are consistent with Grade I diastolic dysfunction (impaired relaxation). Right Ventricle: The right ventricular size is normal. No increase in right ventricular wall thickness. Right ventricular systolic function is normal. Left Atrium: Left  atrial size was normal in size. Right Atrium: Right atrial size was normal in size. Pericardium: There is no evidence of pericardial effusion. Mitral Valve: The mitral valve is grossly normal. No evidence of mitral valve regurgitation. Tricuspid Valve: The tricuspid valve is not well visualized. Tricuspid valve regurgitation is not demonstrated. Aortic Valve: The aortic valve was not well visualized. Aortic valve regurgitation is trivial. Aortic valve peak gradient measures 8.1 mmHg. Pulmonic Valve: The pulmonic valve was not well visualized. Aorta: The aortic root and ascending aorta are structurally normal, with no evidence of dilitation. Venous: The inferior vena cava is normal in size with greater than 50% respiratory variability, suggesting right atrial pressure of 3 mmHg. IAS/Shunts: No atrial level shunt detected by color flow Doppler.  LEFT VENTRICLE PLAX 2D LVIDd:         4.50 cm     Diastology LVIDs:         2.90 cm     LV e' medial:    4.05 cm/s LV PW:         1.00 cm     LV E/e' medial:  13.8 LV IVS:        0.90 cm     LV e' lateral:   6.81 cm/s LVOT diam:     1.80 cm     LV E/e' lateral: 8.2 LV SV:         57 LV SV Index:   35 LVOT Area:     2.54 cm  LV Volumes (MOD) LV vol d, MOD A2C: 71.8 ml LV vol d, MOD A4C: 69.1 ml LV vol s, MOD A2C: 24.6 ml LV vol s, MOD A4C: 26.8 ml LV SV MOD A2C:     47.2 ml LV SV MOD A4C:     69.1 ml LV SV MOD BP:      45.2 ml RIGHT VENTRICLE             IVC RV Basal diam:  2.90 cm     IVC diam: 1.20 cm RV Mid diam:    2.50 cm RV S prime:     10.50 cm/s TAPSE (M-mode): 2.0 cm LEFT ATRIUM             Index        RIGHT ATRIUM            Index LA diam:        2.60 cm 1.58 cm/m   RA Area:     11.20 cm LA Vol (A2C):   40.8 ml 24.84 ml/m  RA Volume:   22.00 ml  13.39 ml/m LA Vol (A4C):   12.9 ml 7.85 ml/m LA Biplane Vol: 22.7 ml 13.82 ml/m  AORTIC VALVE AV Area (Vmax): 1.70 cm AV Vmax:        142.00 cm/s AV Peak Grad:   8.1 mmHg LVOT Vmax:      94.60 cm/s LVOT Vmean:     67.000 cm/s LVOT VTI:       0.225 m  AORTA Ao Root diam: 2.60 cm Ao Asc diam:  2.60 cm MITRAL VALVE MV Area (PHT): 3.51 cm    SHUNTS MV Decel Time: 216 msec    Systemic VTI:  0.22 m MV E velocity: 55.70 cm/s  Systemic Diam: 1.80 cm MV A velocity: 75.40 cm/s MV E/A ratio:  0.74 Mary Scientist, physiological signed by Phineas Inches Signature Date/Time: 02/14/2021/2:40:49 PM    Final      Discharge Exam: Vitals:   02/14/21  1305 02/14/21 1330  BP: (!) 167/63 (!) 158/75  Pulse: 66 77  Resp: 15 16  Temp:  97.7 F (36.5 C)  SpO2: 98% 99%   Vitals:   02/14/21 0800 02/14/21 1231 02/14/21 1305 02/14/21 1330  BP: 127/60  (!) 167/63 (!) 158/75  Pulse: (!) 52 67 66 77  Resp: 15 16 15 16   Temp:    97.7 F (36.5 C)  TempSrc:    Oral  SpO2: 99% 98% 98% 99%  Weight:      Height:        General: Pt is alert, awake, not in acute distress Cardiovascular: RRR, S1/S2 +, no rubs, no gallops Respiratory: CTA bilaterally, no wheezing, no rhonchi Abdominal: Soft, NT, ND, bowel sounds + Extremities: no edema, no cyanosis    The results of significant diagnostics from this hospitalization (including imaging, microbiology, ancillary and laboratory) are listed below for reference.     Microbiology: Recent Results (from the past 240 hour(s))  Resp Panel by RT-PCR (Flu A&B, Covid) Nasopharyngeal Swab     Status: None   Collection Time: 02/13/21 11:04 AM   Specimen: Nasopharyngeal Swab; Nasopharyngeal(NP) swabs in vial transport medium  Result Value Ref Range Status   SARS Coronavirus 2 by RT PCR NEGATIVE NEGATIVE Final    Comment: (NOTE) SARS-CoV-2 target nucleic  acids are NOT DETECTED.  The SARS-CoV-2 RNA is generally detectable in upper respiratory specimens during the acute phase of infection. The lowest concentration of SARS-CoV-2 viral copies this assay can detect is 138 copies/mL. A negative result does not preclude SARS-Cov-2 infection and should not be used as the sole basis for treatment or other patient management decisions. A negative result may occur with  improper specimen collection/handling, submission of specimen other than nasopharyngeal swab, presence of viral mutation(s) within the areas targeted by this assay, and inadequate number of viral copies(<138 copies/mL). A negative result must be combined with clinical observations, patient history, and epidemiological information. The expected result is Negative.  Fact Sheet for Patients:  EntrepreneurPulse.com.au  Fact Sheet for Healthcare Providers:  IncredibleEmployment.be  This test is no t yet approved or cleared by the [place] FDA and  has been authorized for detection and/or diagnosis of SARS-CoV-2 by FDA under an Emergency Use Authorization (EUA). This EUA will remain  in effect (meaning this test can be used) for the duration of the COVID-19 declaration under Section 564(b)(1) of the Act, 21 U.S.C.section 360bbb-3(b)(1), unless the authorization is terminated  or revoked sooner.       Influenza A by PCR NEGATIVE NEGATIVE Final   Influenza B by PCR NEGATIVE NEGATIVE Final    Comment: (NOTE) The Xpert Xpress SARS-CoV-2/FLU/RSV plus assay is intended as an aid in the diagnosis of influenza from Nasopharyngeal swab specimens and should not be used as a sole basis for treatment. Nasal washings and aspirates are unacceptable for Xpert Xpress SARS-CoV-2/FLU/RSV testing.  Fact Sheet for Patients: EntrepreneurPulse.com.au  Fact Sheet for Healthcare Providers: IncredibleEmployment.be  This  test is not yet approved or cleared by the [place] FDA and has been authorized for detection and/or diagnosis of SARS-CoV-2 by FDA under an Emergency Use Authorization (EUA). This EUA will remain in effect (meaning this test can be used) for the duration of the COVID-19 declaration under Section 564(b)(1) of the Act, 21 U.S.C. section 360bbb-3(b)(1), unless the authorization is terminated or revoked.  Performed at [facility], [place] [address], [place], [place] [address]      Labs: BNP (  last 3 results) No results for input(s): BNP in the last 8760 hours. Basic Metabolic Panel: Recent Labs  Lab 02/12/21 0918 02/13/21 1245 02/14/21 0441  NA 137 136   138 134*  K 4.4 3.8   3.7 3.3*  CL 99 99   98 97*  CO2 31 26 23   GLUCOSE 126* 158*   157* 134*  BUN 15 16   17 14   CREATININE 0.68 0.61   0.40* 0.60  CALCIUM 10.0 9.7 9.5   Liver Function Tests: Recent Labs  Lab 02/12/21 0918 02/13/21 1245  AST 32 52*  ALT 31 52*  ALKPHOS 42 46  BILITOT 0.6 0.6  PROT 7.3 7.5  ALBUMIN 4.5 4.6   No results for input(s): LIPASE, AMYLASE in the last 168 hours. No results for input(s): AMMONIA in the last 168 hours. CBC: Recent Labs  Lab 02/12/21 0918 02/13/21 1245 02/14/21 0441  WBC 7.6 6.8 6.8  NEUTROABS 3.9 4.5  --   HGB 13.2 14.6   15.0 14.2  HCT 38.3 41.2   44.0 40.7  MCV 88.9 89.0 89.8  PLT 179 192 187   Cardiac Enzymes: No results for input(s): CKTOTAL, CKMB, CKMBINDEX, TROPONINI in the last 168 hours. BNP: Invalid input(s): POCBNP CBG: Recent Labs  Lab 02/14/21 0813 02/14/21 1201  GLUCAP 121* 143*   D-Dimer Recent Labs    02/13/21 1245  DDIMER 0.34   Hgb A1c Recent Labs    02/14/21 0441  HGBA1C 7.4*   Lipid Profile Recent Labs    02/14/21 0441  CHOL 135  HDL 54  LDLCALC 64  TRIG 85  CHOLHDL 2.5   Thyroid function studies No results for input(s): TSH, T4TOTAL, T3FREE, THYROIDAB in the last 72 hours.  Invalid input(s): FREET3 Anemia  work up No results for input(s): VITAMINB12, FOLATE, FERRITIN, TIBC, IRON, RETICCTPCT in the last 72 hours. Urinalysis    Component Value Date/Time   COLORURINE STRAW (A) 02/13/2021 1103   APPEARANCEUR CLEAR 02/13/2021 1103   LABSPEC 1.002 (L) 02/13/2021 1103   PHURINE 8.0 02/13/2021 1103   GLUCOSEU >=500 (A) 02/13/2021 1103   HGBUR SMALL (A) 02/13/2021 1103   BILIRUBINUR NEGATIVE 02/13/2021 1103   KETONESUR NEGATIVE 02/13/2021 1103   PROTEINUR NEGATIVE 02/13/2021 1103   NITRITE NEGATIVE 02/13/2021 1103   LEUKOCYTESUR NEGATIVE 02/13/2021 1103   Sepsis Labs Invalid input(s): PROCALCITONIN,  WBC,  LACTICIDVEN Microbiology Recent Results (from the past 240 hour(s))  Resp Panel by RT-PCR (Flu A&B, Covid) Nasopharyngeal Swab     Status: None   Collection Time: 02/13/21 11:04 AM   Specimen: Nasopharyngeal Swab; Nasopharyngeal(NP) swabs in vial transport medium  Result Value Ref Range Status   SARS Coronavirus 2 by RT PCR NEGATIVE NEGATIVE Final    Comment: (NOTE) SARS-CoV-2 target nucleic acids are NOT DETECTED.  The SARS-CoV-2 RNA is generally detectable in upper respiratory specimens during the acute phase of infection. The lowest concentration of SARS-CoV-2 viral copies this assay can detect is 138 copies/mL. A negative result does not preclude SARS-Cov-2 infection and should not be used as the sole basis for treatment or other patient management decisions. A negative result may occur with  improper specimen collection/handling, submission of specimen other than nasopharyngeal swab, presence of viral mutation(s) within the areas targeted by this assay, and inadequate number of viral copies(<138 copies/mL). A negative result must be combined with clinical observations, patient history, and epidemiological information. The expected result is Negative.  Fact Sheet for Patients:  EntrepreneurPulse.com.au  Fact Sheet for  Healthcare Providers:   IncredibleEmployment.be  This test is no t yet approved or cleared by the [place] and  has been authorized for detection and/or diagnosis of SARS-CoV-2 by FDA under an Emergency Use Authorization (EUA). This EUA will remain  in effect (meaning this test can be used) for the duration of the COVID-19 declaration under Section 564(b)(1) of the Act, 21 U.S.C.section 360bbb-3(b)(1), unless the authorization is terminated  or revoked sooner.       Influenza A by PCR NEGATIVE NEGATIVE Final   Influenza B by PCR NEGATIVE NEGATIVE Final    Comment: (NOTE) The Xpert Xpress SARS-CoV-2/FLU/RSV plus assay is intended as an aid in the diagnosis of influenza from Nasopharyngeal swab specimens and should not be used as a sole basis for treatment. Nasal washings and aspirates are unacceptable for Xpert Xpress SARS-CoV-2/FLU/RSV testing.  Fact Sheet for Patients: EntrepreneurPulse.com.au  Fact Sheet for Healthcare Providers: IncredibleEmployment.be  This test is not yet approved or cleared by the [place] FDA and has been authorized for detection and/or diagnosis of SARS-CoV-2 by FDA under an Emergency Use Authorization (EUA). This EUA will remain in effect (meaning this test can be used) for the duration of the COVID-19 declaration under Section 564(b)(1) of the Act, 21 U.S.C. section 360bbb-3(b)(1), unless the authorization is terminated or revoked.  Performed at [facility], [place] [address], [place], [place] [address]      Time coordinating discharge: 35 minutes  SIGNED:   Rodena Goldmann, DO [facility] 02/14/2021, 3:34 PM  If 7PM-7AM, please contact night-coverage [email or web address]

## 2021-02-14 NOTE — ED Notes (Signed)
Patient transported to CT 

## 2021-02-23 ENCOUNTER — Telehealth: Payer: Self-pay | Admitting: Hematology

## 2021-02-23 NOTE — Telephone Encounter (Signed)
Rescheduled past appointment per provider's request. Patient's husband is aware of changes.

## 2021-02-26 ENCOUNTER — Other Ambulatory Visit: Payer: Self-pay

## 2021-02-26 ENCOUNTER — Inpatient Hospital Stay (HOSPITAL_BASED_OUTPATIENT_CLINIC_OR_DEPARTMENT_OTHER): Payer: Self-pay | Admitting: Hematology

## 2021-02-26 ENCOUNTER — Encounter: Payer: Self-pay | Admitting: Hematology

## 2021-02-26 DIAGNOSIS — D0511 Intraductal carcinoma in situ of right breast: Secondary | ICD-10-CM

## 2021-02-26 NOTE — Progress Notes (Signed)
[place]   Telephone:([phone]) [phone] Fax:(336) [phone]   Clinic Follow up Note   Patient Care Team: Patient, No Pcp Per (Inactive) as PCP - General (General Practice) Gail Pickett Nadean Corwin, MD as PCP - Cardiology (Cardiology) Gail Cheese, NP as Nurse Practitioner (Hematology and Oncology) Gail Merle, MD as Consulting Physician (Hematology) Gail Seltzer, MD (Inactive) as Consulting Physician (General Surgery) Gail Silversmith, MD as Consulting Physician (Radiation Oncology)  Date of Service:  02/26/2021  I connected with Gail Manning on 02/26/2021 at  3:40 PM EST by telephone visit and verified that I am speaking with the correct person using two identifiers.  I discussed the limitations, risks, security and privacy concerns of performing an evaluation and management service by telephone and the availability of in person appointments. I also discussed with the patient that there may be a patient responsible charge related to this service. The patient expressed understanding and agreed to proceed.   Other persons participating in the visit and their role in the encounter:  interpreter  Patient's location:  home Provider's location:  my office  CHIEF COMPLAINT: f/u of right breast DCIS  CURRENT THERAPY:  Surveillance  ASSESSMENT & PLAN:  Gail Manning is a 76 y.o. female with   1. Right breast DCIS, intermediate grade, ER/PR strongly positive -Diagnosed in 01/2015. S/p lumpectomy and adjuvant radiation. She declined anti-estrogen therapy  -Currently under surveillance.  -she presented for breast exam and mammogram with [facility] on 02/13/21, but she began to feel badly upon arrival and was referred to the ED. She was treated for hypertension and hyperglycemia  -she expressed some concern about her breast pain, and wants to be seen in person. I have asked her to reschedule her mammogram and see me back in the office afterwards for f/u.   2.  Chronic intermittent bilateral breast pain  -unchanged. Prior exam showed tenderness, otherwise unremarkable.  -mammogram was postponed due to elevated BP; she will reschedule.   3. 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 (No lab)   No problem-specific Assessment & Plan notes found for this encounter.   SUMMARY OF ONCOLOGIC HISTORY: Oncology History Overview Note  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, right   Staging form: Breast, AJCC 7th Edition     Clinical stage from 01/16/2015: Stage 0 (Tis (DCIS), N0, M0) - Signed by Gail Merle, MD on 01/25/2015     Pathologic stage from 02/10/2015: Stage 0 (Tis (DCIS), N0, cM0) - Signed by Gail Merle, MD on 04/28/2015      Ductal carcinoma in situ (DCIS) of right breast  01/2015 Mammogram   small area of calcification in right breast    01/16/2015 Initial Biopsy   Right breast core needle bx: DCIS ER+ (100%), PR+ (30%)   01/16/2015 Clinical Stage   Stage 0: Tis N0   02/10/2015 Surgery    right breast lumpectomy   02/10/2015 Pathology Results    right breast lumpectomy showed DCIS with calcification, intermediate grade,  anterior margin focally less than 0.1 cm, re-excision margins were negative    02/10/2015 Pathologic Stage   Stage 0: Tis No   03/29/2015 - 04/26/2015 Radiation Therapy    adjuvant breast irradiation    Anti-estrogen oral therapy   Declined    Survivorship   Care plan (in [place] and [place]) mailed to patient  in lieu of in person visit   12/11/2017 Mammogram   12/11/2017 Mammogram IMPRESSION: No mammographic evidence of malignancy.      INTERVAL HISTORY:  Gail Manning was contacted for a follow up of breast cancer. She was last seen by NP Gail Manning one year ago. She tells me about how she went to the [facility] for mammogram and breast exam, but while waiting at the front desk, she began to feel  badly with dizziness, jaw pain, chest tightness. Per office note from that day, she was found to have elevated BP of 200/156, and she was subsequently referred to the ED. She reports pain to her breasts, left more than right.   All other systems were reviewed with the patient and are negative.  MEDICAL HISTORY:  Past Medical History:  Diagnosis Date   Breast cancer ([place]) 02/2015   Right Breast   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, right 01/20/2015   Cataracts, bilateral    Diabetes mellitus without complication ([place])    Hyperlipidemia    Hypertension    Personal history of radiation therapy 2017   Right Breast Cancer   Retinitis pigmentosa    Urinary and fecal incontinence    urinary only    SURGICAL HISTORY: Past Surgical History:  Procedure Laterality Date   BREAST LUMPECTOMY Right 02/2015   BREAST LUMPECTOMY WITH NEEDLE LOCALIZATION Right 02/10/2015   Procedure: RIGHT BREAST LUMPECTOMY WITH NEEDLE LOCALIZATION;  Surgeon: Gail Seltzer, MD;  Location: [facility];  Service: General;  Laterality: Right;   CESAREAN SECTION     x3    I have reviewed the social history and family history with the patient and they are unchanged from previous note.  ALLERGIES:  is allergic to albuterol sulfate and scopolamine.  MEDICATIONS:  Current Outpatient Medications  Medication Sig Dispense Refill   amLODipine (NORVASC) 5 MG tablet Take 1 tablet (5 mg total) by mouth daily. 30 tablet 2   aspirin EC 81 MG tablet Take 81 mg by mouth daily.     calcium & magnesium carbonates (MYLANTA) 914-782 MG tablet Take 3 tablets by mouth daily.     Calcium Carb-Cholecalciferol (CALCIUM 1000 + D PO) Take 2 tablets by mouth daily.     glimepiride (AMARYL) 1 MG tablet Take 1 mg by mouth daily.     hydrOXYzine (ATARAX) 50 MG tablet Take 50 mg by mouth at bedtime as needed for sleep.     Hypromellose (SYSTANE OVERNIGHT THERAPY OP) Place 1 drop into both eyes 3 (three) times daily.      lisinopril-hydrochlorothiazide (ZESTORETIC) 20-12.5 MG tablet Take 1 tablet by mouth daily.     lovastatin (MEVACOR) 20 MG tablet Take 20 mg by mouth at bedtime.     metFORMIN (GLUCOPHAGE) 500 MG tablet Take 500 mg by mouth 2 (two) times daily with a meal.     omega-3 acid ethyl esters (LOVAZA) 1 g capsule Take 1 g by mouth 2 (two) times daily.     tolterodine (DETROL LA) 4 MG 24 hr capsule Take 4 mg by mouth daily. (Patient not taking: Reported on 02/13/2021)     vitamin E 1000 UNIT capsule Take 1,000 Units by mouth daily.     VITAMIN E SKIN OIL Apply 1 application topically daily.     No current facility-administered medications for this visit.    PHYSICAL EXAMINATION: ECOG PERFORMANCE STATUS: 1 - Symptomatic but completely ambulatory  There were no vitals filed for this visit. Wt Readings  from Last 3 Encounters:  02/13/21 140 lb (63.5 kg)  02/17/20 140 lb 8 oz (63.7 kg)  01/13/20 144 lb 12.8 oz (65.7 kg)     No vitals taken today, Exam not performed today  LABORATORY DATA:  I have reviewed the data as listed CBC Latest Ref Rng & Units 02/14/2021 02/13/2021 02/13/2021  WBC 4.0 - 10.5 K/uL 6.8 6.8 -  Hemoglobin 12.0 - 15.0 g/dL 14.2 14.6 15.0  Hematocrit 36.0 - 46.0 % 40.7 41.2 44.0  Platelets 150 - 400 K/uL 187 192 -     CMP Latest Ref Rng & Units 02/14/2021 02/13/2021 02/13/2021  Glucose 70 - 99 mg/dL 134(H) 158(H) 157(H)  BUN 8 - 23 mg/dL 14 16 17   Creatinine 0.44 - 1.00 mg/dL 0.60 0.61 0.40(L)  Sodium 135 - 145 mmol/L 134(L) 136 138  Potassium 3.5 - 5.1 mmol/L 3.3(L) 3.8 3.7  Chloride 98 - 111 mmol/L 97(L) 99 98  CO2 22 - 32 mmol/L 23 26 -  Calcium 8.9 - 10.3 mg/dL 9.5 9.7 -  Total Protein 6.5 - 8.1 g/dL - 7.5 -  Total Bilirubin 0.3 - 1.2 mg/dL - 0.6 -  Alkaline Phos 38 - 126 U/L - 46 -  AST 15 - 41 U/L - 52(H) -  ALT 0 - 44 U/L - 52(H) -      RADIOGRAPHIC STUDIES: I have personally reviewed the radiological images as listed and agreed with the findings in the  report. No results found.    No orders of the defined types were placed in this encounter.  All questions were answered. The patient knows to call the clinic with any problems, questions or concerns. No barriers to learning was detected. The total time spent in the appointment was 15 minutes.     Gail Merle, MD 02/26/2021   I, Wilburn Mylar, am acting as scribe for Gail Merle, MD.   I have reviewed the above documentation for accuracy and completeness, and I agree with the above.
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
## 2021-02-27 ENCOUNTER — Telehealth: Payer: Self-pay | Admitting: Licensed Clinical Social Worker

## 2021-02-27 ENCOUNTER — Encounter (HOSPITAL_BASED_OUTPATIENT_CLINIC_OR_DEPARTMENT_OTHER): Payer: Self-pay | Admitting: Physician Assistant

## 2021-02-27 ENCOUNTER — Other Ambulatory Visit: Payer: Self-pay

## 2021-02-27 ENCOUNTER — Ambulatory Visit (INDEPENDENT_AMBULATORY_CARE_PROVIDER_SITE_OTHER): Payer: Self-pay | Admitting: Physician Assistant

## 2021-02-27 VITALS — BP 110/70 | HR 78 | Ht 63.0 in | Wt 137.9 lb

## 2021-02-27 DIAGNOSIS — I10 Essential (primary) hypertension: Secondary | ICD-10-CM

## 2021-02-27 DIAGNOSIS — E785 Hyperlipidemia, unspecified: Secondary | ICD-10-CM

## 2021-02-27 DIAGNOSIS — E119 Type 2 diabetes mellitus without complications: Secondary | ICD-10-CM

## 2021-02-27 DIAGNOSIS — R079 Chest pain, unspecified: Secondary | ICD-10-CM

## 2021-02-27 NOTE — Telephone Encounter (Signed)
LCSW reached out to [facility] as pt is uninsured, one of her accounts has been assigned for Medicaid screening by their team. They have attempted to reach pt x1. I will follow, if not eligible for Medicaid may be eligible for Daiva Huge, [place] and [place], MSW, [place]  [phone]- work cell phone (preferred) [phone]- desk phone

## 2021-02-27 NOTE — Patient Instructions (Addendum)
Medication Instructions:  None ordered today.   *If you need a refill on your cardiac medications before your next appointment, please call your pharmacy*  Lab Work: None today.   Testing/Procedures: None ordered today.   Follow-Up: At [facility], you and your health needs are our priority.  As part of our continuing mission to provide you with exceptional heart care, we have created designated Provider Care Teams.  These Care Teams include your primary Cardiologist (physician) and Advanced Practice Providers (APPs -  Physician Assistants and Nurse Practitioners) who all work together to provide you with the care you need, when you need it.  We recommend signing up for the patient portal called "MyChart".  Sign up information is provided on this After Visit Summary.  MyChart is used to connect with patients for Virtual Visits (Telemedicine).  Patients are able to view lab/test results, encounter notes, upcoming appointments, etc.  Non-urgent messages can be sent to your provider as well.   To learn more about what you can do with MyChart, go to [email or web address].    Your next appointment:   6 month(s)  The format for your next appointment:   In Person  Provider:   Pixie Casino, MD     Other Instructions  You could trial over the counter Melatonin for sleep. Take 30 minutes before bed. If this does not help, recommend discussing with your primary care provider.   Recommend checking blood sugar four times per day and keeping a log to discuss with your primary care provider.   Tips to Measure your Blood Pressure Correctly  Contact our office if blood pressure consistently more than 140 or less than 110 for the top number.   Continue to check BP once per day.   Here's what you can do to ensure a correct reading:  Don't drink a caffeinated beverage or smoke during the 30 minutes before the test.  Sit quietly for five minutes before the test begins.  During the  measurement, sit in a chair with your feet on the floor and your arm supported so your elbow is at about heart level.  The inflatable part of the cuff should completely cover at least 80% of your upper arm, and the cuff should be placed on bare skin, not over a shirt.  Don't talk during the measurement.  Have your blood pressure measured twice, with a brief break in between. If the readings are different by 5 points or more, have it done a third time.  Blood Pressure Log   Date   Time  Blood Pressure  Example: Nov 1 9 AM 124/78

## 2021-02-28 ENCOUNTER — Other Ambulatory Visit: Payer: Self-pay | Admitting: Hematology

## 2021-02-28 ENCOUNTER — Telehealth: Payer: Self-pay | Admitting: Hematology

## 2021-02-28 DIAGNOSIS — Z1231 Encounter for screening mammogram for malignant neoplasm of breast: Secondary | ICD-10-CM

## 2021-02-28 NOTE — Telephone Encounter (Signed)
Scheduled follow-up appointment per 2/20 los. Patient is aware.

## 2021-03-27 ENCOUNTER — Other Ambulatory Visit: Payer: Self-pay

## 2021-03-27 ENCOUNTER — Ambulatory Visit
Admission: RE | Admit: 2021-03-27 | Discharge: 2021-03-27 | Disposition: A | Discharge status: Home / Self Care | Payer: No Typology Code available for payment source | Source: Ambulatory Visit | Attending: Hematology | Admitting: Hematology

## 2021-03-27 DIAGNOSIS — Z1231 Encounter for screening mammogram for malignant neoplasm of breast: Secondary | ICD-10-CM

## 2021-04-06 ENCOUNTER — Inpatient Hospital Stay: Payer: No Typology Code available for payment source | Admitting: Hematology

## 2021-04-06 ENCOUNTER — Ambulatory Visit: Payer: No Typology Code available for payment source | Admitting: Hematology

## 2021-04-13 ENCOUNTER — Encounter: Payer: Self-pay | Admitting: Hematology

## 2021-04-13 ENCOUNTER — Inpatient Hospital Stay: Payer: Self-pay | Attending: Hematology | Admitting: Hematology

## 2021-04-13 ENCOUNTER — Other Ambulatory Visit: Payer: Self-pay

## 2021-04-13 VITALS — BP 135/61 | HR 85 | Resp 18 | Wt 137.3 lb

## 2021-04-13 DIAGNOSIS — Z170 Estrogen receptor positive status [ER+]: Secondary | ICD-10-CM | POA: Insufficient documentation

## 2021-04-13 DIAGNOSIS — Z7984 Long term (current) use of oral hypoglycemic drugs: Secondary | ICD-10-CM | POA: Insufficient documentation

## 2021-04-13 DIAGNOSIS — Z923 Personal history of irradiation: Secondary | ICD-10-CM | POA: Insufficient documentation

## 2021-04-13 DIAGNOSIS — D0511 Intraductal carcinoma in situ of right breast: Secondary | ICD-10-CM | POA: Insufficient documentation

## 2021-04-13 DIAGNOSIS — E785 Hyperlipidemia, unspecified: Secondary | ICD-10-CM | POA: Insufficient documentation

## 2021-04-13 DIAGNOSIS — E1136 Type 2 diabetes mellitus with diabetic cataract: Secondary | ICD-10-CM | POA: Insufficient documentation

## 2021-04-13 DIAGNOSIS — M25569 Pain in unspecified knee: Secondary | ICD-10-CM | POA: Insufficient documentation

## 2021-04-13 DIAGNOSIS — I10 Essential (primary) hypertension: Secondary | ICD-10-CM | POA: Insufficient documentation

## 2021-04-13 DIAGNOSIS — E119 Type 2 diabetes mellitus without complications: Secondary | ICD-10-CM | POA: Insufficient documentation

## 2021-04-13 DIAGNOSIS — N644 Mastodynia: Secondary | ICD-10-CM | POA: Insufficient documentation

## 2021-04-13 DIAGNOSIS — Z1231 Encounter for screening mammogram for malignant neoplasm of breast: Secondary | ICD-10-CM

## 2021-04-13 DIAGNOSIS — Z7982 Long term (current) use of aspirin: Secondary | ICD-10-CM | POA: Insufficient documentation

## 2021-04-13 DIAGNOSIS — Z79899 Other long term (current) drug therapy: Secondary | ICD-10-CM | POA: Insufficient documentation
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
## 2021-09-01 ENCOUNTER — Other Ambulatory Visit: Payer: Self-pay

## 2021-09-01 ENCOUNTER — Emergency Department (HOSPITAL_COMMUNITY): Payer: No Typology Code available for payment source

## 2021-09-01 ENCOUNTER — Emergency Department (HOSPITAL_COMMUNITY)
Admission: EM | Admit: 2021-09-01 | Discharge: 2021-09-01 | Disposition: A | Discharge status: Home / Self Care | Payer: No Typology Code available for payment source | Attending: Emergency Medicine | Admitting: Emergency Medicine

## 2021-09-01 ENCOUNTER — Encounter (HOSPITAL_COMMUNITY): Payer: Self-pay | Admitting: *Deleted

## 2021-09-01 DIAGNOSIS — S060X0A Concussion without loss of consciousness, initial encounter: Secondary | ICD-10-CM | POA: Insufficient documentation

## 2021-09-01 DIAGNOSIS — Z7982 Long term (current) use of aspirin: Secondary | ICD-10-CM | POA: Insufficient documentation

## 2021-09-01 DIAGNOSIS — S0990XA Unspecified injury of head, initial encounter: Secondary | ICD-10-CM

## 2021-09-01 DIAGNOSIS — E871 Hypo-osmolality and hyponatremia: Secondary | ICD-10-CM | POA: Insufficient documentation

## 2021-09-01 DIAGNOSIS — W01198A Fall on same level from slipping, tripping and stumbling with subsequent striking against other object, initial encounter: Secondary | ICD-10-CM | POA: Insufficient documentation

## 2021-09-01 LAB — COMPREHENSIVE METABOLIC PANEL
ALT: 130 U/L — ABNORMAL HIGH (ref 0–44)
AST: 69 U/L — ABNORMAL HIGH (ref 15–41)
Albumin: 4.5 g/dL (ref 3.5–5.0)
Alkaline Phosphatase: 41 U/L (ref 38–126)
Anion gap: 11 (ref 5–15)
BUN: 11 mg/dL (ref 8–23)
CO2: 23 mmol/L (ref 22–32)
Calcium: 9.4 mg/dL (ref 8.9–10.3)
Chloride: 93 mmol/L — ABNORMAL LOW (ref 98–111)
Creatinine, Ser: 0.62 mg/dL (ref 0.44–1.00)
GFR, Estimated: >60 mL/min (ref >60)
Glucose, Bld: 233 mg/dL — ABNORMAL HIGH (ref 70–99)
Potassium: 3.6 mmol/L (ref 3.5–5.1)
Sodium: 127 mmol/L — ABNORMAL LOW (ref 135–145)
Total Bilirubin: 0.8 mg/dL (ref 0.3–1.2)
Total Protein: 7.5 g/dL (ref 6.5–8.1)

## 2021-09-01 LAB — CBC
HCT: 39.5 % (ref 36.0–46.0)
Hemoglobin: 14.1 g/dL (ref 12.0–15.0)
MCH: 31.5 pg (ref 26.0–34.0)
MCHC: 35.7 g/dL (ref 30.0–36.0)
MCV: 88.2 fL (ref 80.0–100.0)
Platelets: 217 10*3/uL (ref 150–400)
RBC: 4.48 MIL/uL (ref 3.87–5.11)
RDW: 12.4 % (ref 11.5–15.5)
WBC: 7.2 10*3/uL (ref 4.0–10.5)
nRBC: 0 % (ref 0.0–0.2)

## 2021-09-01 MED ORDER — ACETAMINOPHEN 500 MG PO TABS
1000.0000 mg | ORAL_TABLET | Freq: Once | ORAL | Status: DC
  Filled 2021-09-01: qty 2

## 2021-09-01 MED ORDER — ONDANSETRON 4 MG PO TBDP
4.0000 mg | ORAL_TABLET | Freq: Once | ORAL | Status: AC
  Administered 2021-09-01: 4 mg via ORAL
  Filled 2021-09-01: qty 1

## 2021-09-01 MED ORDER — ONDANSETRON HCL 4 MG PO TABS: 4.0000 mg | ORAL_TABLET | Freq: Four times a day (QID) | ORAL | 0 refills | Status: DC

## 2021-09-01 NOTE — ED Triage Notes (Signed)
Pt is blind, Via spanish interpreter, pt states yesterday around 1400, pt says she was trying to hold on to something, and it snapped, she lost her balance fell on the right side, onto the hip/leg/arm and head. No LOC, no blood thinners. Around 8pm the pain in her head became worse and she had several episodes of vomiting. Pain in the right shoulder to palpation and right hip.

## 2021-09-01 NOTE — Discharge Instructions (Signed)
Return for any problem.   Your salt levels in your blood are less than normal - this is likely secondary to mild dehydration. Drink plenty of fluids at home. Follow up closely with your regular care provider as instructed for re-check of your sodium levels in 1-2 weeks.

## 2021-09-01 NOTE — ED Notes (Signed)
Pt off floor for CT.

## 2021-09-01 NOTE — ED Provider Notes (Signed)
[facility] EMERGENCY DEPARTMENT Provider Note   CSN: [id number] Arrival date & time: 09/01/21  0430     History  Chief Complaint  Patient presents with   [facility] Gail Manning is a 76 y.o. female.  76 year old female with prior medical history as detailed below presents for evaluation.  Patient reports a fall that occurred today around 2:00.  She lost her balance and fell onto her right side.  She did strike her head and her right arm.  Ports that overnight her headache became worse.  She had some vomiting with this.  She feels improved now.  She was able to ambulate after the fall.  The history is provided by the patient and medical records. A language interpreter was used.  Fall This is a new problem. The current episode started yesterday. The problem occurs rarely. The problem has not changed since onset.      Home Medications Prior to Admission medications   Medication Sig Start Date End Date Taking? Authorizing Provider  amLODipine (NORVASC) 5 MG tablet Take 1 tablet (5 mg total) by mouth daily. 02/15/21 03/17/21  Manuella Ghazi, Pratik D, DO  aspirin EC 81 MG tablet Take 81 mg by mouth daily.    [provider]  calcium & magnesium carbonates (MYLANTA) 466-599 MG tablet Take 3 tablets by mouth daily.    [provider]  Calcium Carb-Cholecalciferol (CALCIUM 1000 + D PO) Take 2 tablets by mouth daily.    [provider]  glimepiride (AMARYL) 1 MG tablet Take 1 mg by mouth daily. 01/29/21   [provider]  hydrOXYzine (ATARAX) 50 MG tablet Take 50 mg by mouth at bedtime as needed for sleep. 02/03/21   [provider]  Hypromellose (SYSTANE OVERNIGHT THERAPY OP) Place 1 drop into both eyes 3 (three) times daily.    [provider]  lisinopril-hydrochlorothiazide (ZESTORETIC) 20-12.5 MG tablet Take 1 tablet by mouth daily. 02/03/21   [provider]  lovastatin (MEVACOR) 20 MG tablet Take 20 mg by  mouth at bedtime.    [provider]  metFORMIN (GLUCOPHAGE) 500 MG tablet Take 500 mg by mouth 2 (two) times daily with a meal.    [provider]  omega-3 acid ethyl esters (LOVAZA) 1 g capsule Take 1 g by mouth 2 (two) times daily.    [provider]  sulfamethoxazole-trimethoprim (BACTRIM DS) 800-160 MG tablet Take 1 tablet by mouth 2 (two) times daily. 02/03/21   [provider]  tolterodine (DETROL LA) 4 MG 24 hr capsule Take 4 mg by mouth daily. 12/22/20   [provider]  vitamin E 1000 UNIT capsule Take 1,000 Units by mouth daily.    [provider]  VITAMIN E SKIN OIL Apply 1 application topically daily.    [provider]      Allergies    Albuterol sulfate and Scopolamine    Review of Systems   Review of Systems  All other systems reviewed and are negative.   Physical Exam Updated Vital Signs BP (!) 147/71   Pulse 78   Temp 98.8 F (37.1 C) (Oral)   Resp 16   SpO2 99%  Physical Exam Vitals and nursing note reviewed.  Constitutional:      General: She is not in acute distress.    Appearance: Normal appearance. She is well-developed.  HENT:     Head: Normocephalic and atraumatic.  Eyes:     Conjunctiva/sclera: Conjunctivae normal.  Pupils: Pupils are equal, round, and reactive to light.  Cardiovascular:     Rate and Rhythm: Normal rate and regular rhythm.     Heart sounds: Normal heart sounds.  Pulmonary:     Effort: Pulmonary effort is normal. No respiratory distress.     Breath sounds: Normal breath sounds.  Abdominal:     General: There is no distension.     Palpations: Abdomen is soft.     Tenderness: There is no abdominal tenderness.  Musculoskeletal:        General: No deformity. Normal range of motion.     Cervical back: Normal range of motion and neck supple.  Skin:    General: Skin is warm and dry.  Neurological:     General: No focal deficit present.     Mental Status: She is alert  and oriented to person, place, and time. Mental status is at baseline.     Cranial Nerves: No cranial nerve deficit.     Sensory: No sensory deficit.     Motor: No weakness.     Coordination: Coordination normal.     ED Results / Procedures / Treatments   Labs (all labs ordered are listed, but only abnormal results are displayed) Labs Reviewed  COMPREHENSIVE METABOLIC PANEL - Abnormal; Notable for the following components:      Result Value   Sodium 127 (*)    Chloride 93 (*)    Glucose, Bld 233 (*)    AST 69 (*)    ALT 130 (*)    All other components within normal limits  CBC    EKG None  Radiology CT Head Wo Contrast  Result Date: 09/01/2021 CLINICAL DATA:  76 year old female with history of trauma from a fall. EXAM: CT HEAD WITHOUT CONTRAST CT CERVICAL SPINE WITHOUT CONTRAST TECHNIQUE: Multidetector CT imaging of the head and cervical spine was performed following the standard protocol without intravenous contrast. Multiplanar CT image reconstructions of the cervical spine were also generated. RADIATION DOSE REDUCTION: This exam was performed according to the departmental dose-optimization program which includes automated exposure control, adjustment of the mA and/or kV according to patient size and/or use of iterative reconstruction technique. COMPARISON:  Head CT 02/13/2021. FINDINGS: CT HEAD FINDINGS Brain: Mild cerebral atrophy. Patchy and confluent areas of decreased attenuation are noted throughout the deep and periventricular white matter of the cerebral hemispheres bilaterally, compatible with chronic microvascular ischemic disease. Physiologic calcifications in the basal ganglia bilaterally incidentally noted. No evidence of acute infarction, hemorrhage, hydrocephalus, extra-axial collection or mass lesion/mass effect. Vascular: No hyperdense vessel or unexpected calcification. Skull: Normal. Negative for fracture or focal lesion. Sinuses/Orbits: No acute finding. Other:  None. CT CERVICAL SPINE FINDINGS Alignment: Normal. Skull base and vertebrae: No acute fracture. No primary bone lesion or focal pathologic process. Soft tissues and spinal canal: No prevertebral fluid or swelling. No visible canal hematoma. Disc levels: Mild multilevel degenerative disc disease, most apparent at C5-C6 and C6-C7. Mild multilevel facet arthropathy. Upper chest: Unremarkable. Other: None. IMPRESSION: 1. No evidence of significant acute traumatic injury to the skull, brain or cervical spine. 2. Mild cerebral atrophy with chronic microvascular ischemic changes in the cerebral white matter, as above. 3. Mild multilevel degenerative disc disease and cervical spondylosis. Electronically Signed   By: Vinnie Langton M.D.   On: 09/01/2021 07:42   CT Cervical Spine Wo Contrast  Result Date: 09/01/2021 CLINICAL DATA:  76 year old female with history of trauma from a fall. EXAM: CT HEAD WITHOUT CONTRAST CT  CERVICAL SPINE WITHOUT CONTRAST TECHNIQUE: Multidetector CT imaging of the head and cervical spine was performed following the standard protocol without intravenous contrast. Multiplanar CT image reconstructions of the cervical spine were also generated. RADIATION DOSE REDUCTION: This exam was performed according to the departmental dose-optimization program which includes automated exposure control, adjustment of the mA and/or kV according to patient size and/or use of iterative reconstruction technique. COMPARISON:  Head CT 02/13/2021. FINDINGS: CT HEAD FINDINGS Brain: Mild cerebral atrophy. Patchy and confluent areas of decreased attenuation are noted throughout the deep and periventricular white matter of the cerebral hemispheres bilaterally, compatible with chronic microvascular ischemic disease. Physiologic calcifications in the basal ganglia bilaterally incidentally noted. No evidence of acute infarction, hemorrhage, hydrocephalus, extra-axial collection or mass lesion/mass effect. Vascular: No  hyperdense vessel or unexpected calcification. Skull: Normal. Negative for fracture or focal lesion. Sinuses/Orbits: No acute finding. Other: None. CT CERVICAL SPINE FINDINGS Alignment: Normal. Skull base and vertebrae: No acute fracture. No primary bone lesion or focal pathologic process. Soft tissues and spinal canal: No prevertebral fluid or swelling. No visible canal hematoma. Disc levels: Mild multilevel degenerative disc disease, most apparent at C5-C6 and C6-C7. Mild multilevel facet arthropathy. Upper chest: Unremarkable. Other: None. IMPRESSION: 1. No evidence of significant acute traumatic injury to the skull, brain or cervical spine. 2. Mild cerebral atrophy with chronic microvascular ischemic changes in the cerebral white matter, as above. 3. Mild multilevel degenerative disc disease and cervical spondylosis. Electronically Signed   By: Vinnie Langton M.D.   On: 09/01/2021 07:42   DG Shoulder Right  Result Date: 09/01/2021 CLINICAL DATA:  Fall in home onto right side.  Pain. EXAM: RIGHT SHOULDER - 2+ VIEW COMPARISON:  None Available. FINDINGS: Rotator cuff calcification. No acute fracture or dislocation. Degenerative spurring at the [facility] joint. IMPRESSION: 1. No acute fracture or dislocation. 2. AC joint spurring and rotator cuff calcification. Electronically Signed   By: Jorje Guild M.D.   On: 09/01/2021 06:36   DG Hip Unilat  With Pelvis 2-3 Views Right  Result Date: 09/01/2021 CLINICAL DATA:  76 year old female with history of trauma from a fall complaining of right hip pain. EXAM: DG HIP (WITH OR WITHOUT PELVIS) 2-3V RIGHT COMPARISON:  No priors. FINDINGS: AP view of the bony pelvis and AP and lateral views of the right hip demonstrate no acute displaced fracture of the bony pelvic ring. Bilateral proximal femurs as visualized are intact, and the right femoral head is located. Joint space narrowing, subchondral sclerosis, subchondral cyst formation and osteophyte formation is noted in the  hip joints bilaterally, indicative of moderate osteoarthritis. IMPRESSION: 1. No acute radiographic abnormality of the bony pelvis or the right hip. 2. Moderate bilateral hip joint osteoarthritis. Electronically Signed   By: Vinnie Langton M.D.   On: 09/01/2021 06:35    Procedures Procedures    Medications Ordered in ED Medications  ondansetron (ZOFRAN-ODT) disintegrating tablet 4 mg (4 mg Oral Given 09/01/21 0754)    ED Course/ Medical Decision Making/ A&P                           Medical Decision Making Amount and/or Complexity of Data Reviewed Labs: ordered. Radiology: ordered.  Risk Prescription drug management.    Medical Screen Complete  This patient presented to the ED with complaint of fall, headache.  This complaint involves an extensive number of treatment options. The initial differential diagnosis includes, but is not limited to, trauma related to  fall, metabolic abnormality, etc.  This presentation is: Acute, Self-Limited, Previously Undiagnosed, Uncertain Prognosis, Complicated, Systemic Symptoms, and Threat to Life/Bodily Function  Patient reports fall that occurred yesterday.  She reports headache related to fall.  Exam is not suggestive of significant traumatic injury.  Imaging obtained is without evidence of acute abnormality.  Patient is noted to be mildly hyponatremic and mildly hyperglycemic on screening labs.  These lab findings was discussed with the patient.  Patient feels significantly improved at time of this discussion regarding the patient's hyponatremia.  She desires discharge home.  She is advised to drink plenty of fluids at home.  She is advised to follow-up closely with her regular care provider in the outpatient setting for recheck of her sodium within the next week.  Patient understands these instructions.  Interpreter used was used at time of discharge during this discussion.  Family members present at bedside also understood outpatient  plan of care.  Importance of close follow-up is repeatedly stressed.  Strict return precautions given and understood.  Additional history obtained:  External records from outside sources obtained and reviewed including prior ED visits and prior Inpatient records.    Lab Tests:  I ordered and personally interpreted labs.  The pertinent results include: CBC, CMP   Imaging Studies ordered:  I ordered imaging studies including CT head, CT C-spine, plain films of right shoulder and right hip with pelvis I independently visualized and interpreted obtained imaging which showed NAD I agree with the radiologist interpretation.   Cardiac Monitoring:  The patient was maintained on a cardiac monitor.  I personally viewed and interpreted the cardiac monitor which showed an underlying rhythm of: NSR   Medicines ordered:  I ordered medication including Zofran for nausea Reevaluation of the patient after these medicines showed that the patient: improved   Problem List / ED Course:  Fall, headache, hyponatremia   Reevaluation:  After the interventions noted above, I reevaluated the patient and found that they have: improved   Disposition:  After consideration of the diagnostic results and the patients response to treatment, I feel that the patent would benefit from outpatient follow-up.          Final Clinical Impression(s) / ED Diagnoses Final diagnoses:  Injury of head, initial encounter  Concussion without loss of consciousness, initial encounter    Rx / DC Orders ED Discharge Orders     None         Valarie Merino, MD 09/01/21 [phone]

## 2021-09-01 NOTE — ED Notes (Signed)
Discharge instructions reviewed and education provided via [place] interpreter. All questions answered. Pt and family verbalized understanding. Pt taken to ER exit in wheelchair in stable condition with all belongings

## 2021-09-01 NOTE — ED Notes (Signed)
Patient transported to CT/xray 

## 2021-09-01 NOTE — ED Notes (Signed)
Pt back from CT

## 2021-10-02 ENCOUNTER — Other Ambulatory Visit: Payer: Self-pay

## 2021-10-02 ENCOUNTER — Emergency Department (HOSPITAL_COMMUNITY)
Admission: EM | Admit: 2021-10-02 | Discharge: 2021-10-03 | Disposition: A | Discharge status: Home / Self Care | Payer: No Typology Code available for payment source | Attending: Emergency Medicine | Admitting: Emergency Medicine

## 2021-10-02 ENCOUNTER — Emergency Department (HOSPITAL_COMMUNITY): Payer: No Typology Code available for payment source

## 2021-10-02 ENCOUNTER — Encounter (HOSPITAL_COMMUNITY): Payer: Self-pay | Admitting: Emergency Medicine

## 2021-10-02 DIAGNOSIS — Z7982 Long term (current) use of aspirin: Secondary | ICD-10-CM | POA: Insufficient documentation

## 2021-10-02 DIAGNOSIS — I10 Essential (primary) hypertension: Secondary | ICD-10-CM | POA: Insufficient documentation

## 2021-10-02 DIAGNOSIS — R739 Hyperglycemia, unspecified: Secondary | ICD-10-CM

## 2021-10-02 DIAGNOSIS — Z79899 Other long term (current) drug therapy: Secondary | ICD-10-CM | POA: Insufficient documentation

## 2021-10-02 DIAGNOSIS — E1165 Type 2 diabetes mellitus with hyperglycemia: Secondary | ICD-10-CM | POA: Insufficient documentation

## 2021-10-02 DIAGNOSIS — Z7984 Long term (current) use of oral hypoglycemic drugs: Secondary | ICD-10-CM | POA: Insufficient documentation

## 2021-10-02 DIAGNOSIS — R079 Chest pain, unspecified: Secondary | ICD-10-CM | POA: Insufficient documentation

## 2021-10-02 DIAGNOSIS — R112 Nausea with vomiting, unspecified: Secondary | ICD-10-CM

## 2021-10-02 LAB — CBC WITH DIFFERENTIAL/PLATELET
Abs Immature Granulocytes: 0.02 10*3/uL (ref 0.00–0.07)
Basophils Absolute: 0 10*3/uL (ref 0.0–0.1)
Basophils Relative: 1 %
Eosinophils Absolute: 0.1 10*3/uL (ref 0.0–0.5)
Eosinophils Relative: 1 %
HCT: 34.9 % — ABNORMAL LOW (ref 36.0–46.0)
Hemoglobin: 12.3 g/dL (ref 12.0–15.0)
Immature Granulocytes: 0 %
Lymphocytes Relative: 34 %
Lymphs Abs: 2 10*3/uL (ref 0.7–4.0)
MCH: 31.7 pg (ref 26.0–34.0)
MCHC: 35.2 g/dL (ref 30.0–36.0)
MCV: 89.9 fL (ref 80.0–100.0)
Monocytes Absolute: 0.5 10*3/uL (ref 0.1–1.0)
Monocytes Relative: 9 %
Neutro Abs: 3.2 10*3/uL (ref 1.7–7.7)
Neutrophils Relative %: 55 %
Platelets: 223 10*3/uL (ref 150–400)
RBC: 3.88 MIL/uL (ref 3.87–5.11)
RDW: 13 % (ref 11.5–15.5)
WBC: 5.8 10*3/uL (ref 4.0–10.5)
nRBC: 0 % (ref 0.0–0.2)

## 2021-10-02 LAB — I-STAT VENOUS BLOOD GAS, ED
Acid-Base Excess: 3 mmol/L — ABNORMAL HIGH (ref 0.0–2.0)
Bicarbonate: 25.7 mmol/L (ref 20.0–28.0)
Calcium, Ion: 1.06 mmol/L — ABNORMAL LOW (ref 1.15–1.40)
HCT: 37 % (ref 36.0–46.0)
Hemoglobin: 12.6 g/dL (ref 12.0–15.0)
O2 Saturation: 100 %
Potassium: 3.6 mmol/L (ref 3.5–5.1)
Sodium: 130 mmol/L — ABNORMAL LOW (ref 135–145)
TCO2: 27 mmol/L (ref 22–32)
pCO2, Ven: 34 mmHg — ABNORMAL LOW (ref 44–60)
pH, Ven: 7.487 — ABNORMAL HIGH (ref 7.25–7.43)
pO2, Ven: 203 mmHg — ABNORMAL HIGH (ref 32–45)

## 2021-10-02 LAB — COMPREHENSIVE METABOLIC PANEL
ALT: 86 U/L — ABNORMAL HIGH (ref 0–44)
AST: 60 U/L — ABNORMAL HIGH (ref 15–41)
Albumin: 4.3 g/dL (ref 3.5–5.0)
Alkaline Phosphatase: 89 U/L (ref 38–126)
Anion gap: 14 (ref 5–15)
BUN: 7 mg/dL — ABNORMAL LOW (ref 8–23)
CO2: 22 mmol/L (ref 22–32)
Calcium: 9.9 mg/dL (ref 8.9–10.3)
Chloride: 95 mmol/L — ABNORMAL LOW (ref 98–111)
Creatinine, Ser: 0.66 mg/dL (ref 0.44–1.00)
GFR, Estimated: >60 mL/min (ref >60)
Glucose, Bld: 341 mg/dL — ABNORMAL HIGH (ref 70–99)
Potassium: 3.7 mmol/L (ref 3.5–5.1)
Sodium: 131 mmol/L — ABNORMAL LOW (ref 135–145)
Total Bilirubin: 0.7 mg/dL (ref 0.3–1.2)
Total Protein: 7.3 g/dL (ref 6.5–8.1)

## 2021-10-02 LAB — MAGNESIUM: Magnesium: 1.9 mg/dL (ref 1.7–2.4)

## 2021-10-02 LAB — TROPONIN I (HIGH SENSITIVITY): Troponin I (High Sensitivity): 12 ng/L (ref <18)

## 2021-10-02 NOTE — ED Provider Triage Note (Signed)
Emergency Medicine Provider Triage Evaluation Note  Gail Manning , a 76 y.o. female  was evaluated in triage.  Pt complains of hyperglycemia, hypertension.  Patient reports brief episode of chest pain and shortness of breath which have resolved.  Patient reports blood sugar was in the upper 300s.  Patient did take her metformin this evening.  She reports dry mouth.  No vomiting or diarrhea.  No leg swelling.  Interpreter was used.  Review of Systems  Positive: Chest pain, shortness of breath, hyperglycemia Negative: Fever, chills, vomiting, diarrhea  Physical Exam  BP (!) 175/67   Pulse 94   Temp 98.2 F (36.8 C) (Oral)   Resp 20   SpO2 98%  Gen:   Awake, no distress   Resp:  Normal effort  MSK:   Moves extremities without difficulty  Other:  Heart regular rate and rhythm.  Lungs clear to auscultation bilaterally.  Medical Decision Making  Medically screening exam initiated at 7:53 PM.  Appropriate orders placed.  Gail Manning was informed that the remainder of the evaluation will be completed by another provider, this initial triage assessment does not replace that evaluation, and the importance of remaining in the ED until their evaluation is complete.  Patient presents for hypertension and hyperglycemia.  She reports brief episode of chest pain or shortness of breath.  Patient is hypertensive.  Otherwise vital signs reassuring.  Basic labs were obtained to assess for any evidence of DKA or ACS.   Doristine Devoid, PA-C 10/02/21 1955

## 2021-10-02 NOTE — ED Triage Notes (Signed)
Patient here with shortness of breath, chest pain, hypertension and hyperglycemia.  Patient states that it has been going on today.  Patient is very concerned about the medications she has been taking for her high blood sugar.

## 2021-10-03 LAB — CBG MONITORING, ED: Glucose-Capillary: 256 mg/dL — ABNORMAL HIGH (ref 70–99)

## 2021-10-03 LAB — TROPONIN I (HIGH SENSITIVITY): Troponin I (High Sensitivity): 14 ng/L (ref <18)

## 2021-10-03 MED ORDER — PROCHLORPERAZINE EDISYLATE 10 MG/2ML IJ SOLN: 10.0000 mg | Freq: Once | INTRAMUSCULAR | Status: DC

## 2021-10-03 MED ORDER — ONDANSETRON 8 MG PO TBDP: 8.0000 mg | ORAL_TABLET | Freq: Three times a day (TID) | ORAL | 0 refills | Status: DC | PRN

## 2021-10-03 MED ORDER — LACTATED RINGERS IV BOLUS: 1000.0000 mL | Freq: Once | INTRAVENOUS | Status: DC

## 2021-10-03 MED ORDER — ONDANSETRON 4 MG PO TBDP
8.0000 mg | ORAL_TABLET | Freq: Once | ORAL | Status: AC
  Administered 2021-10-03: 8 mg via ORAL
  Filled 2021-10-03: qty 2

## 2021-10-03 MED ORDER — ONDANSETRON HCL 4 MG/2ML IJ SOLN: 4.0000 mg | Freq: Once | INTRAMUSCULAR | Status: DC

## 2021-10-03 NOTE — ED Provider Notes (Signed)
[facility] EMERGENCY DEPARTMENT Provider Note   CSN: [id number] Arrival date & time: 10/02/21  1848     History  Chief Complaint  Patient presents with   Hypertension   Chest Pain    Gail Manning is a 76 y.o. female.  The history is provided by the patient. A language interpreter was used.  Hypertension Associated symptoms include chest pain.  Chest Pain She has history of hypertension, diabetes, hyperlipidemia and comes in because of chest pain and vomiting.  She had an episode of chest pain last night which had resolved.  There is no associated dyspnea.  She had been nauseous at home and started vomiting after arriving in the ED.  However, by then, her chest pain had resolved.  There is no associated dyspnea but she did have some diaphoresis.  She did have medicine for nausea at home, but did not have it with her when she was in the emergency department.  She actually had been having nausea throughout the day yesterday.  She denies fever, chills.  She did check her blood sugar at home and it was elevated at 383.  She is concerned because she recently had her metformin dose decreased.  She also states that her blood pressure had been elevated at home, but she does not recall what it was.  She denies fever.  There has been no diarrhea.  She is a non-smoker.   Home Medications Prior to Admission medications   Medication Sig Start Date End Date Taking? Authorizing Provider  amLODipine (NORVASC) 5 MG tablet Take 1 tablet (5 mg total) by mouth daily. 02/15/21 03/17/21  Manuella Ghazi, Pratik D, DO  aspirin EC 81 MG tablet Take 81 mg by mouth daily.    [provider]  calcium & magnesium carbonates (MYLANTA) 353-299 MG tablet Take 3 tablets by mouth daily.    [provider]  Calcium Carb-Cholecalciferol (CALCIUM 1000 + D PO) Take 2 tablets by mouth daily.    [provider]  glimepiride (AMARYL) 1 MG tablet Take 1 mg by mouth daily. 01/29/21    [provider]  hydrOXYzine (ATARAX) 50 MG tablet Take 50 mg by mouth at bedtime as needed for sleep. 02/03/21   [provider]  Hypromellose (SYSTANE OVERNIGHT THERAPY OP) Place 1 drop into both eyes 3 (three) times daily.    [provider]  lisinopril-hydrochlorothiazide (ZESTORETIC) 20-12.5 MG tablet Take 1 tablet by mouth daily. 02/03/21   [provider]  lovastatin (MEVACOR) 20 MG tablet Take 20 mg by mouth at bedtime.    [provider]  metFORMIN (GLUCOPHAGE) 500 MG tablet Take 500 mg by mouth 2 (two) times daily with a meal.    [provider]  omega-3 acid ethyl esters (LOVAZA) 1 g capsule Take 1 g by mouth 2 (two) times daily.    [provider]  ondansetron (ZOFRAN) 4 MG tablet Take 1 tablet (4 mg total) by mouth every 6 (six) hours. 09/01/21   Valarie Merino, MD  sulfamethoxazole-trimethoprim (BACTRIM DS) 800-160 MG tablet Take 1 tablet by mouth 2 (two) times daily. 02/03/21   [provider]  tolterodine (DETROL LA) 4 MG 24 hr capsule Take 4 mg by mouth daily. 12/22/20   [provider]  vitamin E 1000 UNIT capsule Take 1,000 Units by mouth daily.    [provider]  VITAMIN E SKIN OIL Apply 1 application topically daily.    [provider]  Allergies    Albuterol sulfate and Scopolamine    Review of Systems   Review of Systems  Cardiovascular:  Positive for chest pain.  All other systems reviewed and are negative.   Physical Exam Updated Vital Signs BP (!) 143/66 (BP Location: Left Arm)   Pulse 88   Temp 99.3 F (37.4 C)   Resp 18   SpO2 100%  Physical Exam Vitals and nursing note reviewed.   76 year old female, resting comfortably and in no acute distress. Vital signs are significant for borderline elevated systolic blood pressure. Oxygen saturation is 100%, which is normal. Head is normocephalic and atraumatic. PERRLA, EOMI. Oropharynx is clear. Neck is  nontender and supple without adenopathy or JVD. Back is nontender and there is no CVA tenderness. Lungs are clear without rales, wheezes, or rhonchi. Chest is nontender. Heart has regular rate and rhythm without murmur. Abdomen is soft, flat, nontender. Extremities have no cyanosis or edema, full range of motion is present. Skin is warm and dry without rash. Neurologic: Mental status is normal, cranial nerves are intact, moves all extremities equally.  ED Results / Procedures / Treatments   Labs (all labs ordered are listed, but only abnormal results are displayed) Labs Reviewed  CBC WITH DIFFERENTIAL/PLATELET - Abnormal; Notable for the following components:      Result Value   HCT 34.9 (*)    All other components within normal limits  COMPREHENSIVE METABOLIC PANEL - Abnormal; Notable for the following components:   Sodium 131 (*)    Chloride 95 (*)    Glucose, Bld 341 (*)    BUN 7 (*)    AST 60 (*)    ALT 86 (*)    All other components within normal limits  I-STAT VENOUS BLOOD GAS, ED - Abnormal; Notable for the following components:   pH, Ven 7.487 (*)    pCO2, Ven 34.0 (*)    pO2, Ven 203 (*)    Acid-Base Excess 3.0 (*)    Sodium 130 (*)    Calcium, Ion 1.06 (*)    All other components within normal limits  MAGNESIUM  URINALYSIS, ROUTINE W REFLEX MICROSCOPIC  TROPONIN I (HIGH SENSITIVITY)  TROPONIN I (HIGH SENSITIVITY)    EKG EKG Interpretation  Date/Time:  Tuesday October 02 2021 20:06:59 EDT Ventricular Rate:  82 PR Interval:  154 QRS Duration: 88 QT Interval:  350 QTC Calculation: 408 R Axis:   83 Text Interpretation: Normal sinus rhythm with sinus arrhythmia Nonspecific T wave abnormality Abnormal ECG When compared with ECG of 13-Feb-2021 11:36, No significant change was found Confirmed by Delora Fuel ([id number]) on 10/03/2021 12:04:23 AM  Radiology DG Chest 2 View  Result Date: 10/02/2021 CLINICAL DATA:  Chest pain EXAM: CHEST - 2 VIEW COMPARISON:   05/18/2003 FINDINGS: The heart size and mediastinal contours are within normal limits. Both lungs are clear. Aortic atherosclerosis. The visualized skeletal structures are unremarkable. IMPRESSION: No active cardiopulmonary disease. Electronically Signed   By: Donavan Foil M.D.   On: 10/02/2021 20:24    Procedures Procedures    Medications Ordered in ED Medications - No data to display  ED Course/ Medical Decision Making/ A&P                           Medical Decision Making Risk Prescription drug management.   Chest pain which is quite atypical for ACS.  I have low index of suspicion for cardiac disease.  Nausea  and vomiting and pattern suggestive of viral gastritis.  Doubt bowel obstruction, cholecystitis, pancreatitis, diverticulitis.  Elevated glucose level likely stress related.  I have reviewed and interpreted her electrocardiogram, and my interpretation is nonspecific T wave flattening which is unchanged from prior.  Chest x-ray shows no active cardiopulmonary disease.  I have independently viewed the images, and agree with the radiologist's interpretation.  I have reviewed and interpreted her laboratory tests, and my interpretation is mild hyperglycemia without evidence of ketoacidosis, elevation of transaminases which is chronic, normal CBC, normal venous blood gas.  Troponin is also normal x2, no evidence of ACS.  Patient was extremely concerned about her new regimen for her blood sugar.  Glucose was rechecked in the ED, and has come down without any treatment and repeat glucose was 256.  I did offer IV hydration, but patient declined because she has very poor veins.  It seems that her nausea would have been tolerated had she been able to take ondansetron which she has at home.  She is given a dose of ondansetron in the emergency department and is discharged with prescription for ondansetron oral dissolving tablet.  She is to follow-up with her primary care provider for monitoring of her  glucose, hemoglobin A1c, blood pressure and appropriate titration of medications.  Of note, heart score is 4 which puts her at moderate risk for major adverse cardiac events in the next 30 days, but history is not very concerning for cardiac disease.  Final Clinical Impression(s) / ED Diagnoses Final diagnoses:  Nonspecific chest pain  Hyperglycemia  Nausea and vomiting, unspecified vomiting type    Rx / DC Orders ED Discharge Orders     None         Delora Fuel, MD 41/93/79 [phone]

## 2021-10-03 NOTE — Discharge Instructions (Addendum)
Contine tomando su medicamento segn lo recetado. Su mdico puede ajustar su medicamento segn sus niveles de Museum/gallery exhibitions officer y hemoglobina A1C. Debera controlar sus niveles de azcar en sangre en casa. Mantenga un registro de [employer] y llvelo con usted cuando consulte a su mdico.  Regrese si tiene algn problema.

## 2021-10-03 NOTE — ED Notes (Signed)
Dr. Glick at bedside.  

## 2021-10-31 ENCOUNTER — Ambulatory Visit: Payer: No Typology Code available for payment source

## 2021-10-31 DIAGNOSIS — Z23 Encounter for immunization: Secondary | ICD-10-CM

## 2022-03-07 ENCOUNTER — Other Ambulatory Visit: Payer: Self-pay

## 2022-03-07 DIAGNOSIS — Z1231 Encounter for screening mammogram for malignant neoplasm of breast: Secondary | ICD-10-CM

## 2022-03-21 ENCOUNTER — Inpatient Hospital Stay: Admission: RE | Admit: 2022-03-21 | Payer: No Typology Code available for payment source | Source: Ambulatory Visit

## 2022-03-22 ENCOUNTER — Other Ambulatory Visit: Payer: Self-pay

## 2022-04-04 ENCOUNTER — Ambulatory Visit
Admission: RE | Admit: 2022-04-04 | Discharge: 2022-04-04 | Disposition: A | Discharge status: Home / Self Care | Payer: No Typology Code available for payment source | Source: Ambulatory Visit | Attending: Hematology | Admitting: Hematology

## 2022-04-04 DIAGNOSIS — Z1231 Encounter for screening mammogram for malignant neoplasm of breast: Secondary | ICD-10-CM
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
## 2022-04-15 ENCOUNTER — Other Ambulatory Visit: Payer: Self-pay

## 2022-04-15 ENCOUNTER — Telehealth: Payer: Self-pay | Admitting: *Deleted

## 2022-04-15 ENCOUNTER — Inpatient Hospital Stay: Payer: No Typology Code available for payment source | Attending: Nurse Practitioner | Admitting: Nurse Practitioner

## 2022-04-15 ENCOUNTER — Encounter: Payer: Self-pay | Admitting: Nurse Practitioner

## 2022-04-15 VITALS — BP 144/69 | HR 75 | Temp 98.1°F | Resp 13 | Wt 140.8 lb

## 2022-04-15 DIAGNOSIS — Z7984 Long term (current) use of oral hypoglycemic drugs: Secondary | ICD-10-CM | POA: Insufficient documentation

## 2022-04-15 DIAGNOSIS — E119 Type 2 diabetes mellitus without complications: Secondary | ICD-10-CM | POA: Insufficient documentation

## 2022-04-15 DIAGNOSIS — D696 Thrombocytopenia, unspecified: Secondary | ICD-10-CM | POA: Insufficient documentation

## 2022-04-15 DIAGNOSIS — Z923 Personal history of irradiation: Secondary | ICD-10-CM | POA: Insufficient documentation

## 2022-04-15 DIAGNOSIS — H3552 Pigmentary retinal dystrophy: Secondary | ICD-10-CM | POA: Insufficient documentation

## 2022-04-15 DIAGNOSIS — R7401 Elevation of levels of liver transaminase levels: Secondary | ICD-10-CM | POA: Insufficient documentation

## 2022-04-15 DIAGNOSIS — E785 Hyperlipidemia, unspecified: Secondary | ICD-10-CM | POA: Insufficient documentation

## 2022-04-15 DIAGNOSIS — D0511 Intraductal carcinoma in situ of right breast: Secondary | ICD-10-CM | POA: Insufficient documentation

## 2022-04-15 DIAGNOSIS — I10 Essential (primary) hypertension: Secondary | ICD-10-CM | POA: Insufficient documentation

## 2022-04-15 DIAGNOSIS — Z7982 Long term (current) use of aspirin: Secondary | ICD-10-CM | POA: Insufficient documentation

## 2022-04-15 DIAGNOSIS — Z170 Estrogen receptor positive status [ER+]: Secondary | ICD-10-CM | POA: Insufficient documentation

## 2022-04-15 NOTE — Telephone Encounter (Signed)
Faxed OV notes electronically and via fax machine to Ms. Lyda Perone per directions from Santiago Glad, NP.  Fax - [phone] Fax confirmation received.

## 2022-11-12 ENCOUNTER — Ambulatory Visit: Payer: No Typology Code available for payment source

## 2022-11-12 DIAGNOSIS — Z23 Encounter for immunization: Secondary | ICD-10-CM

## 2022-12-17 ENCOUNTER — Other Ambulatory Visit: Payer: Self-pay

## 2022-12-17 ENCOUNTER — Other Ambulatory Visit (HOSPITAL_COMMUNITY): Payer: Self-pay

## 2022-12-17 MED ORDER — GLIPIZIDE 5 MG PO TABS
5.0000 mg | ORAL_TABLET | Freq: Every evening | ORAL | 1 refills | Status: DC
  Filled 2022-12-17: qty 90, 90d supply, fill #0

## 2022-12-17 MED ORDER — METFORMIN HCL 500 MG PO TABS
500.0000 mg | ORAL_TABLET | Freq: Two times a day (BID) | ORAL | 1 refills | Status: DC
  Filled 2022-12-17: qty 180, 90d supply, fill #0
  Filled 2023-03-18: qty 180, 90d supply, fill #1

## 2022-12-17 MED ORDER — LISINOPRIL-HYDROCHLOROTHIAZIDE 20-12.5 MG PO TABS
1.0000 | ORAL_TABLET | Freq: Every day | ORAL | 1 refills | Status: DC
  Filled 2022-12-17: qty 90, 90d supply, fill #0
  Filled 2023-03-18: qty 90, 90d supply, fill #1

## 2022-12-17 MED ORDER — AMLODIPINE BESYLATE 5 MG PO TABS
5.0000 mg | ORAL_TABLET | Freq: Every day | ORAL | 1 refills | Status: DC
  Filled 2022-12-17: qty 90, 90d supply, fill #0
  Filled 2023-03-18: qty 90, 90d supply, fill #1

## 2022-12-17 MED ORDER — ROSUVASTATIN CALCIUM 10 MG PO TABS
5.0000 mg | ORAL_TABLET | Freq: Every evening | ORAL | 1 refills | Status: DC
  Filled 2022-12-17: qty 45, 90d supply, fill #0
  Filled 2023-03-18: qty 45, 90d supply, fill #1

## 2023-02-14 ENCOUNTER — Other Ambulatory Visit: Payer: Self-pay

## 2023-02-14 MED ORDER — ROSUVASTATIN CALCIUM 10 MG PO TABS: 10.0000 mg | ORAL_TABLET | Freq: Every evening | ORAL | 1 refills | Status: DC

## 2023-02-14 MED ORDER — GLIPIZIDE 5 MG PO TABS
5.0000 mg | ORAL_TABLET | Freq: Two times a day (BID) | ORAL | 1 refills | Status: DC
  Filled 2023-03-18: qty 180, 90d supply, fill #0

## 2023-03-18 ENCOUNTER — Other Ambulatory Visit: Payer: Self-pay

## 2023-03-18 ENCOUNTER — Telehealth: Payer: Self-pay

## 2023-03-18 NOTE — Telephone Encounter (Signed)
 Telephoned patient using interpreter, Gail Manning. [facility] will mail patient a mammogram scholarship application.

## 2023-03-24 ENCOUNTER — Other Ambulatory Visit: Payer: Self-pay | Admitting: Hematology

## 2023-03-24 DIAGNOSIS — Z1231 Encounter for screening mammogram for malignant neoplasm of breast: Secondary | ICD-10-CM

## 2023-03-27 ENCOUNTER — Ambulatory Visit
Admission: RE | Admit: 2023-03-27 | Discharge: 2023-03-27 | Discharge status: Home / Self Care | Source: Ambulatory Visit | Attending: Hematology

## 2023-03-27 DIAGNOSIS — Z1231 Encounter for screening mammogram for malignant neoplasm of breast: Secondary | ICD-10-CM

## 2023-04-15 NOTE — Assessment & Plan Note (Signed)
 intermediate grade, ER/PR strongly positive -Diagnosed in 01/2015. S/p lumpectomy and adjuvant radiation. She declined anti-estrogen therapy, currently under surveillance.

## 2023-04-16 ENCOUNTER — Inpatient Hospital Stay: Payer: Self-pay | Attending: Hematology | Admitting: Hematology

## 2023-04-16 VITALS — BP 138/62 | HR 66 | Temp 97.6°F | Resp 20 | Wt 138.5 lb

## 2023-04-16 DIAGNOSIS — D0511 Intraductal carcinoma in situ of right breast: Secondary | ICD-10-CM | POA: Insufficient documentation

## 2023-04-16 DIAGNOSIS — E119 Type 2 diabetes mellitus without complications: Secondary | ICD-10-CM | POA: Insufficient documentation

## 2023-04-16 DIAGNOSIS — I10 Essential (primary) hypertension: Secondary | ICD-10-CM | POA: Insufficient documentation

## 2023-04-16 DIAGNOSIS — Z7984 Long term (current) use of oral hypoglycemic drugs: Secondary | ICD-10-CM | POA: Insufficient documentation

## 2023-04-16 DIAGNOSIS — Z79899 Other long term (current) drug therapy: Secondary | ICD-10-CM | POA: Insufficient documentation

## 2023-04-16 DIAGNOSIS — E785 Hyperlipidemia, unspecified: Secondary | ICD-10-CM | POA: Insufficient documentation

## 2023-04-16 DIAGNOSIS — Z7982 Long term (current) use of aspirin: Secondary | ICD-10-CM | POA: Insufficient documentation

## 2023-04-16 DIAGNOSIS — Z170 Estrogen receptor positive status [ER+]: Secondary | ICD-10-CM | POA: Insufficient documentation

## 2023-04-16 DIAGNOSIS — H3552 Pigmentary retinal dystrophy: Secondary | ICD-10-CM | POA: Insufficient documentation

## 2023-04-16 DIAGNOSIS — N644 Mastodynia: Secondary | ICD-10-CM | POA: Insufficient documentation
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
## 2023-05-23 ENCOUNTER — Other Ambulatory Visit: Payer: Self-pay

## 2023-05-23 MED ORDER — AMLODIPINE BESYLATE 5 MG PO TABS
5.0000 mg | ORAL_TABLET | Freq: Every day | ORAL | 0 refills | Status: DC
  Filled 2023-05-23 – 2023-06-16 (×2): qty 90, 90d supply, fill #0

## 2023-05-23 MED ORDER — GLIPIZIDE 5 MG PO TABS
5.0000 mg | ORAL_TABLET | Freq: Two times a day (BID) | ORAL | 0 refills | Status: DC
  Filled 2023-05-23 – 2023-06-16 (×2): qty 180, 90d supply, fill #0

## 2023-05-23 MED ORDER — ROSUVASTATIN CALCIUM 10 MG PO TABS
10.0000 mg | ORAL_TABLET | Freq: Every day | ORAL | 0 refills | Status: DC
  Filled 2023-05-23: qty 90, 90d supply, fill #0

## 2023-05-23 MED ORDER — LISINOPRIL-HYDROCHLOROTHIAZIDE 20-12.5 MG PO TABS
1.0000 | ORAL_TABLET | Freq: Every day | ORAL | 0 refills | Status: DC
  Filled 2023-05-23 – 2023-06-16 (×2): qty 90, 90d supply, fill #0

## 2023-05-23 MED ORDER — METFORMIN HCL 500 MG PO TABS
500.0000 mg | ORAL_TABLET | Freq: Two times a day (BID) | ORAL | 0 refills | Status: DC
  Filled 2023-05-23 – 2023-06-16 (×2): qty 180, 90d supply, fill #0

## 2023-06-16 ENCOUNTER — Other Ambulatory Visit: Payer: Self-pay

## 2023-08-22 IMAGING — CT CT HEART MORP W/ CTA COR W/ SCORE W/ CA W/CM &/OR W/O CM
1 series · 12 of 14 positions shown, 15 images · non-contrast
Comparison: None.
COMPARISON: None.

Addendum:
EXAM:
OVER-READ INTERPRETATION  CT CHEST

The following report is an over-read performed by radiologist Dr.
Kudesi Vullnet [REDACTED] on 02/14/2021. This
over-read does not include interpretation of cardiac or coronary
anatomy or pathology. The coronary calcium score/coronary CTA
interpretation by the cardiologist is attached.
CLINICAL DATA: 75F with chest pain
Cardiac/Coronary CTA
TECHNIQUE: The patient was scanned on a [employer] Force scanner.

[Series 1926: coronaries · 12 of 14 slices shown, 15 images]
[im 2/14  vessel]
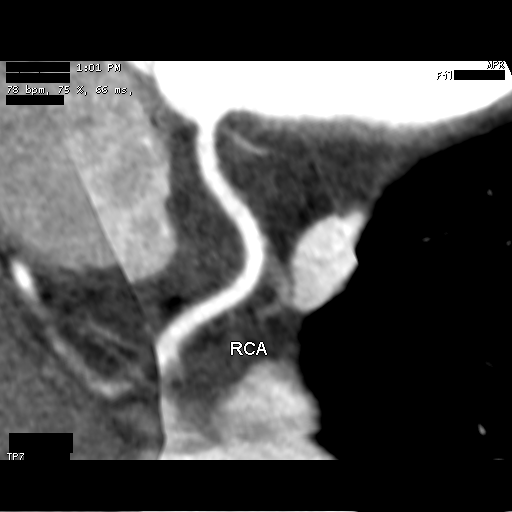
[im 2/14  lung]
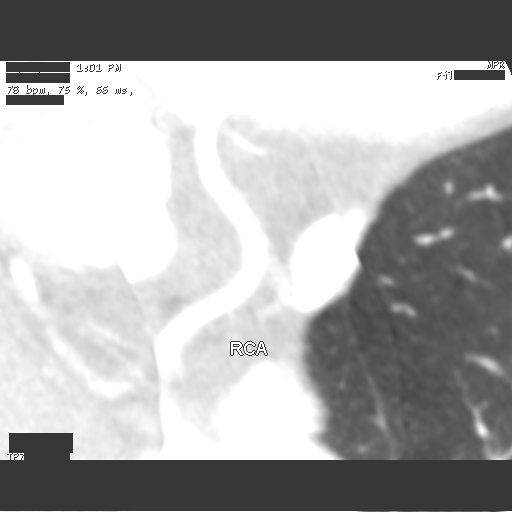
[im 3/14  vessel]
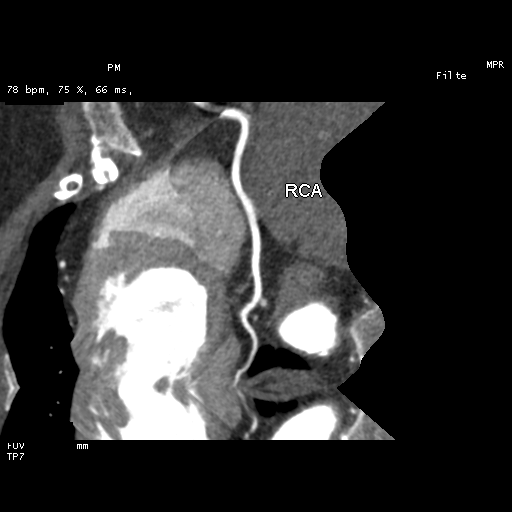
[im 4/14  vessel]
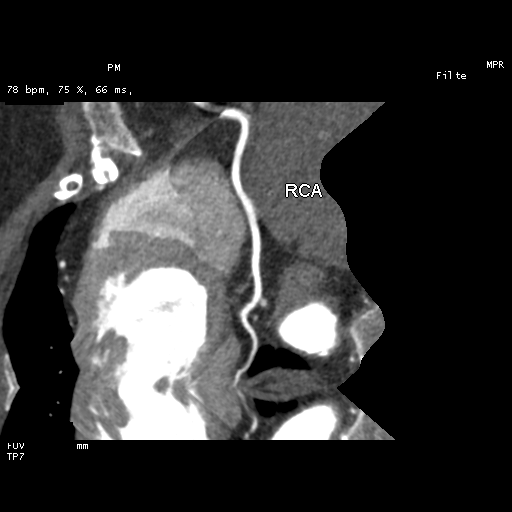
[im 5/14  vessel]
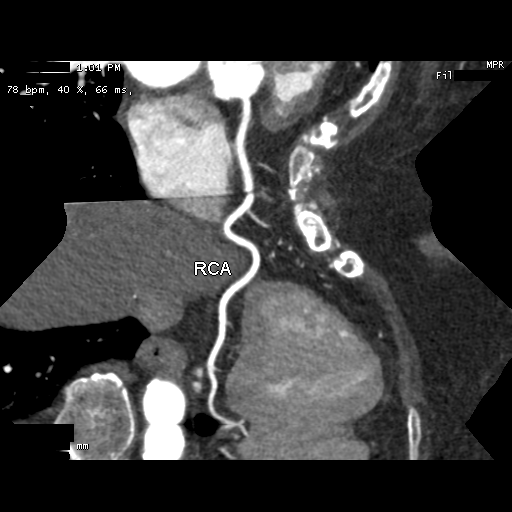
[im 6/14  vessel]
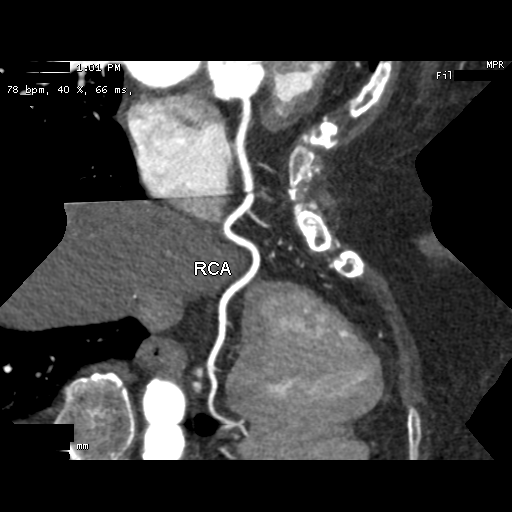
[im 6/14  lung]
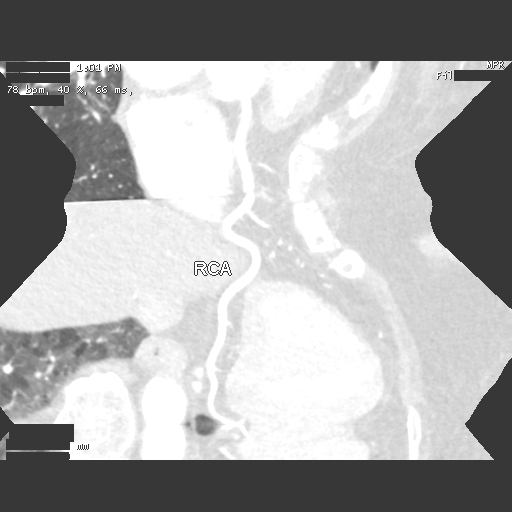
[im 7/14  vessel]
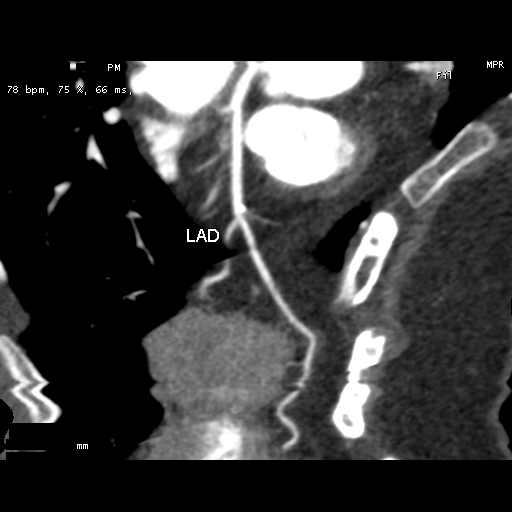
[im 8/14  vessel]
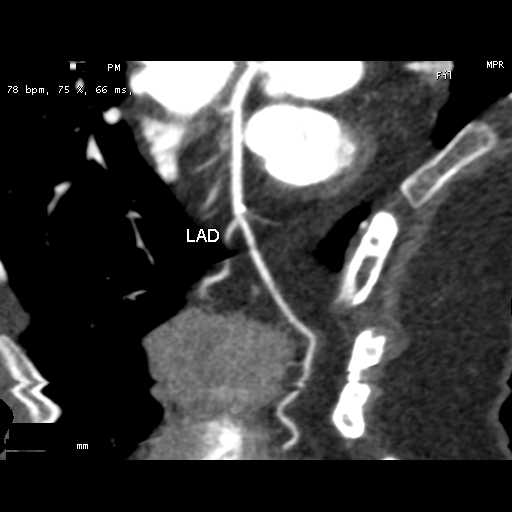
[im 9/14  vessel]
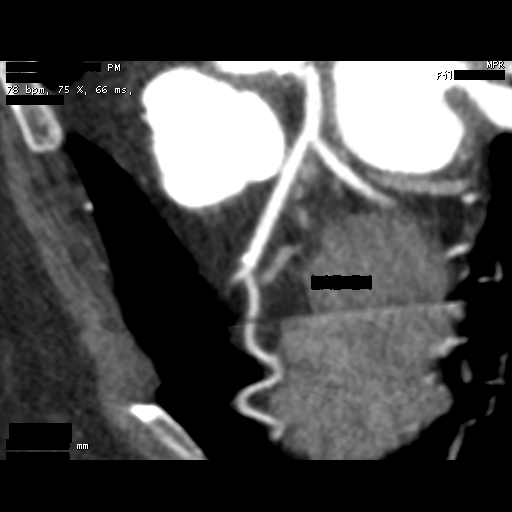
[im 10/14  vessel]
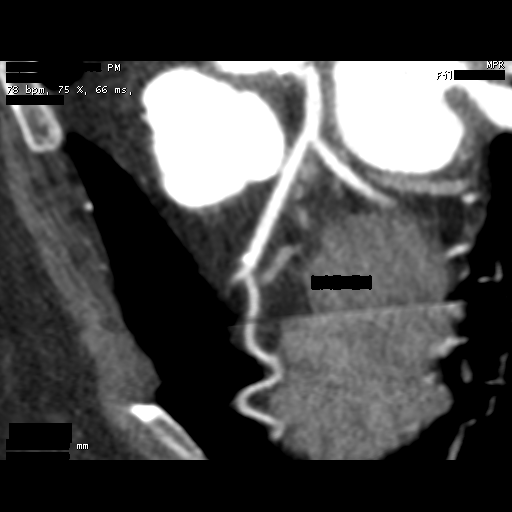
[im 10/14  lung]
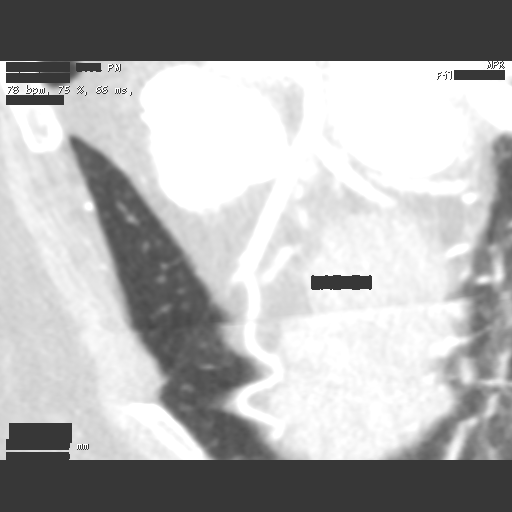
[im 11/14  vessel]
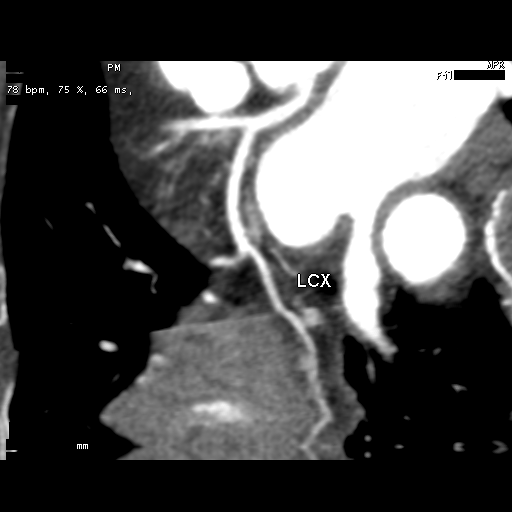
[im 12/14  vessel]
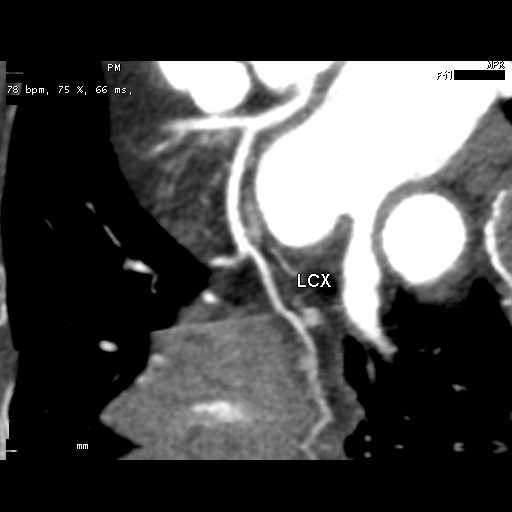
[im 13/14  vessel]
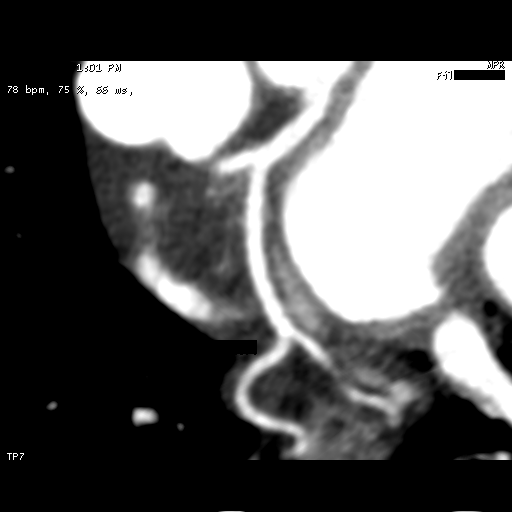

[12 of 14 positions shown; findings below may reference images not displayed]

FINDINGS: Atherosclerotic calcifications in the thoracic aorta. Within the
visualized portions of the thorax there are no suspicious appearing
pulmonary nodules or masses, there is no acute consolidative
airspace disease, no pleural effusions, no pneumothorax and no
lymphadenopathy. Visualized portions of the upper abdomen are
unremarkable. There are no aggressive appearing lytic or blastic
lesions noted in the visualized portions of the skeleton.
IMPRESSION: 1.  Aortic Atherosclerosis (47U5H-VJY.Y).
FINDINGS: A 100 kV prospective scan was triggered in the descending thoracic
aorta at 111 HU's. Axial non-contrast 3 mm slices were carried out
through the heart. The data set was analyzed on a dedicated work
station and scored using the Agatson method. Gantry rotation speed
was 250 msecs and collimation was .6 mm. 0.8 mg of sl NTG was given.
The 3D data set was reconstructed in 5% intervals of the 35-75 % of
the R-R cycle. Phases were analyzed on a dedicated work station
using MPR, MIP and VRT modes. The patient received 80 cc of
contrast.

Coronary Arteries:  Normal coronary origin.  Right dominance.

RCA is a large dominant artery that gives rise to PDA and PLA. There
is noncalcified plaque in proximal RCA causing 0-24% stenosis

Left main is a large artery that gives rise to LAD and LCX arteries.
Noncalcified plaque in left main causes 0-24% stenosis

LAD is a large vessel. Calcified plaque in mid LAD causes 0-24%
stenosis

LCX is a non-dominant artery.  There is no plaque.

Other findings:

Left Ventricle: Normal size

Left Atrium: Normal size

Pulmonary Veins: Normal configuration

Right Ventricle: Normal size

Right Atrium: Normal size

Cardiac valves: No calcifications

Thoracic aorta: Normal size

Pulmonary Arteries: Normal size

Systemic Veins: Normal drainage

Pericardium: Normal thickness
IMPRESSION: 1. Coronary calcium score of 8. This was 49th percentile for age and
sex matched control.

2. Normal coronary origin with right dominance.

3. Nonobstructive CAD with calcified plaque in mid LAD and
noncalcified plaque in proximal RCA and left main causing minimal
(0-24%) stenosis

CAD-RADS 1. Minimal non-obstructive CAD (0-24%). Consider
non-atherosclerotic causes of chest pain. Consider preventive
therapy and risk factor modification.

*** End of Addendum ***
EXAM:
OVER-READ INTERPRETATION  CT CHEST

The following report is an over-read performed by radiologist Dr.
Kudesi Vullnet [REDACTED] on 02/14/2021. This
over-read does not include interpretation of cardiac or coronary
anatomy or pathology. The coronary calcium score/coronary CTA
interpretation by the cardiologist is attached.
FINDINGS: Atherosclerotic calcifications in the thoracic aorta. Within the
visualized portions of the thorax there are no suspicious appearing
pulmonary nodules or masses, there is no acute consolidative
airspace disease, no pleural effusions, no pneumothorax and no
lymphadenopathy. Visualized portions of the upper abdomen are
unremarkable. There are no aggressive appearing lytic or blastic
lesions noted in the visualized portions of the skeleton.
IMPRESSION: 1.  Aortic Atherosclerosis (47U5H-VJY.Y).

## 2023-09-11 ENCOUNTER — Other Ambulatory Visit: Payer: Self-pay

## 2023-09-11 MED ORDER — LISINOPRIL-HYDROCHLOROTHIAZIDE 20-12.5 MG PO TABS
1.0000 | ORAL_TABLET | Freq: Every day | ORAL | 0 refills | Status: DC
  Filled 2023-09-11: qty 90, 90d supply, fill #0

## 2023-09-11 MED ORDER — ROSUVASTATIN CALCIUM 10 MG PO TABS
10.0000 mg | ORAL_TABLET | Freq: Every evening | ORAL | 0 refills | Status: DC
  Filled 2023-09-11: qty 90, 90d supply, fill #0

## 2023-09-11 MED ORDER — METFORMIN HCL 500 MG PO TABS
500.0000 mg | ORAL_TABLET | Freq: Two times a day (BID) | ORAL | 0 refills | Status: DC
  Filled 2023-09-11: qty 180, 90d supply, fill #0

## 2023-09-11 MED ORDER — AMLODIPINE BESYLATE 5 MG PO TABS
5.0000 mg | ORAL_TABLET | Freq: Every day | ORAL | 0 refills | Status: DC
  Filled 2023-09-11: qty 90, 90d supply, fill #0

## 2023-09-11 MED ORDER — GLIPIZIDE 5 MG PO TABS
5.0000 mg | ORAL_TABLET | Freq: Two times a day (BID) | ORAL | 0 refills | Status: DC
  Filled 2023-09-11: qty 180, 90d supply, fill #0

## 2023-09-15 ENCOUNTER — Other Ambulatory Visit: Payer: Self-pay

## 2023-09-24 ENCOUNTER — Other Ambulatory Visit: Payer: Self-pay

## 2023-09-24 ENCOUNTER — Ambulatory Visit: Payer: Self-pay | Admitting: Student

## 2023-09-24 VITALS — BP 146/60 | HR 87 | Temp 98.6°F | Ht 62.0 in | Wt 135.6 lb

## 2023-09-24 DIAGNOSIS — E785 Hyperlipidemia, unspecified: Secondary | ICD-10-CM

## 2023-09-24 DIAGNOSIS — Z833 Family history of diabetes mellitus: Secondary | ICD-10-CM

## 2023-09-24 DIAGNOSIS — Z8249 Family history of ischemic heart disease and other diseases of the circulatory system: Secondary | ICD-10-CM

## 2023-09-24 DIAGNOSIS — Z7189 Other specified counseling: Secondary | ICD-10-CM

## 2023-09-24 DIAGNOSIS — I10 Essential (primary) hypertension: Secondary | ICD-10-CM

## 2023-09-24 DIAGNOSIS — Z79899 Other long term (current) drug therapy: Secondary | ICD-10-CM

## 2023-09-24 DIAGNOSIS — H547 Unspecified visual loss: Secondary | ICD-10-CM

## 2023-09-24 DIAGNOSIS — E1165 Type 2 diabetes mellitus with hyperglycemia: Secondary | ICD-10-CM

## 2023-09-24 DIAGNOSIS — E118 Type 2 diabetes mellitus with unspecified complications: Secondary | ICD-10-CM

## 2023-09-24 DIAGNOSIS — Z7984 Long term (current) use of oral hypoglycemic drugs: Secondary | ICD-10-CM

## 2023-09-24 LAB — GLUCOSE, CAPILLARY: Glucose-Capillary: 231 mg/dL — ABNORMAL HIGH (ref 70–99)

## 2023-09-24 LAB — POCT GLYCOSYLATED HEMOGLOBIN (HGB A1C): HbA1c, POC (controlled diabetic range): 11.2 % — AB (ref 0.0–7.0)

## 2023-09-24 MED ORDER — METFORMIN HCL ER (OSM) 1000 MG PO TB24
2000.0000 mg | ORAL_TABLET | Freq: Every day | ORAL | 11 refills | Status: DC
  Filled 2023-09-24: qty 30, 15d supply, fill #0

## 2023-09-24 MED ORDER — BASAGLAR KWIKPEN 100 UNIT/ML ~~LOC~~ SOPN
10.0000 [IU] | PEN_INJECTOR | Freq: Every day | SUBCUTANEOUS | 3 refills | Status: DC
  Filled 2023-09-24: qty 3, 28d supply, fill #0
  Filled 2023-10-21: qty 3, 28d supply, fill #1

## 2023-09-24 MED ORDER — TRULICITY 0.75 MG/0.5ML ~~LOC~~ SOAJ
0.7500 mg | SUBCUTANEOUS | 11 refills | Status: DC
  Filled 2023-09-24: qty 2, 28d supply, fill #0
  Filled 2023-10-21: qty 2, 28d supply, fill #1

## 2023-09-24 MED ORDER — INSULIN PEN NEEDLE 32G X 4 MM MISC
2.0000 [IU] | Freq: Every day | 11 refills | Status: DC
  Filled 2023-09-24: qty 100, 100d supply, fill #0

## 2023-09-24 NOTE — Patient Instructions (Signed)
 [name] , gracias por permitirnos ayudarle hoy. Durante esta visita hemos hablado acerca de los siguientes asuntos medicos:   DIABETES - LE HE INCREMENTADO EL METFORMIN : 1000 MG EN LA MANANA Y 1000 MG EN LA NOCHE - iNSULINA - INJECTE 10 unidades en la manana O en la noche  - Es importante que se chequee el azucar en ayunas diariamente  - Si tiene problemas con el glucometro dejenos saber  - Monitoree por signos de baja azucar: sudar con frio, sentirse nerviosa o temblorosa  - Si tienes estos sintomas, CHEQUEA EL AZUCAR CON GLUCOMETRO.   - Si el azucar esta por debajo de 80:  como jugo de fruta, refresco normal (no diettico), o caramelos, para [employer] [place]. Espera 15 minutos y vuelve a chequear el azucar  - El ultimo medicamento es: Trulicity  o Dulaglutida PARA DIABETES Y PERDIDA DE PESO: Debe inyectarse 0.75 mg semanalmente. Mantngase hidratado y consuma comidas ms pequeas mientras est tomando [employer].   Est atento a los siguientes signos: - Nuseas, vmitos, distensin abdominal, diarrea o malestar abdominal. - La mayora de los efectos deberan mejorar con el uso de Kay[employer]. - Si no mejora en 2 o 3 das o si tiene una ingesta oral deficiente, experimenta mareos o Dentist, suspenda el medicamento y llame a nuestro consultorio.  Si no experimenta efectos secundarios o [employer], es posible que podamos aumentar su dosis en la prxima visita de seguimiento.   REGRESE EN 2 SEMANAS    Para contactarnos: Esperamos verte la prxima vez. Llame a nuestra clnica al telefono: [phone] si tiene alguna pregunta o inquietud.    Gracias por confiar en nosotros para cuidar de usted!   Gail Kristy Ahr, MD [facility]

## 2023-09-24 NOTE — Progress Notes (Unsigned)
 Subjective:  CC: establish care  HPI:  [name] is a 77 y.o. female with a past medical history stated below and presents today to establish care. Extensive medical disease, uncontrolled, and medication non adherence. Here with spouse, Verner Legato, and interpreter. THIS PATIENT REQUIRES AN EXTENDED CLINIC TIME SLOT FOR INTERPRETER SERVICES AND FOR DIRECT COMMUNICATION WITH PATIENT. Please see problem based assessment and plan for additional details.  Past Medical History:  Diagnosis Date   Breast cancer (HCC) 02/2015   Right Breast   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, right 01/20/2015   Cataracts, bilateral    Diabetes mellitus without complication (HCC)    Hyperlipidemia    Hypertension    Personal history of radiation therapy 2017   Right Breast Cancer   Retinitis pigmentosa    Urinary and fecal incontinence    urinary only    Current Outpatient Medications on File Prior to Visit  Medication Sig Dispense Refill   amLODipine  (NORVASC ) 5 MG tablet Take 1 tablet (5 mg total) by mouth daily. 90 tablet 0   aspirin  EC 81 MG tablet Take 81 mg by mouth daily.     calcium  & magnesium carbonates (MYLANTA) 311-232 MG tablet Take 3 tablets by mouth daily.     Calcium  Carb-Cholecalciferol (CALCIUM  1000 + D PO) Take 2 tablets by mouth daily.     hydrOXYzine (ATARAX) 50 MG tablet Take 50 mg by mouth at bedtime as needed for sleep.     Hypromellose (SYSTANE OVERNIGHT THERAPY OP) Place 1 drop into both eyes 3 (three) times daily.     lisinopril -hydrochlorothiazide  (ZESTORETIC ) 20-12.5 MG tablet Take 1 tablet by mouth daily. 90 tablet 0   lovastatin (MEVACOR) 20 MG tablet Take 20 mg by mouth at bedtime.     omega-3 acid ethyl esters (LOVAZA) 1 g capsule Take 1 g by mouth 2 (two) times daily.     ondansetron  (ZOFRAN ) 4 MG tablet Take 1 tablet (4 mg total) by mouth every 6 (six) hours. 12 tablet 0   ondansetron  (ZOFRAN -ODT) 8 MG disintegrating tablet Take 1 tablet  (8 mg total) by mouth every 8 (eight) hours as needed for nausea or vomiting. 20 tablet 0   rosuvastatin  (CRESTOR ) 10 MG tablet Take 1 tablet (10 mg total) by mouth daily. 90 tablet 0   rosuvastatin  (CRESTOR ) 10 MG tablet Take 1 tablet (10 mg total) by mouth every evening. 90 tablet 0   tolterodine (DETROL LA) 4 MG 24 hr capsule Take 4 mg by mouth daily.     vitamin E 1000 UNIT capsule Take 1,000 Units by mouth daily.     VITAMIN E SKIN OIL Apply 1 application topically daily.     No current facility-administered medications on file prior to visit.    Family History  Problem Relation Age of Onset   Diabetes Mother    Hypertension Mother    Rheum arthritis Mother    Cancer Father        colon   Diabetes Father    Hypertension Father    Breast cancer Sister 4   Hypertension Sister    Diabetes Sister    Diabetes Brother     Social History   Socioeconomic History   Marital status: Married    Spouse name: Not on file   Number of children: 3   Years of education: Not on file   Highest education level: Associate degree: occupational, Scientist, product/process development, or vocational program  Occupational History  Not on file  Tobacco Use   Smoking status: Never   Smokeless tobacco: Never  Vaping Use   Vaping status: Never Used  Substance and Sexual Activity   Alcohol use: No   Drug use: No   Sexual activity: Yes    Birth control/protection: Post-menopausal  Other Topics Concern   Not on file  Social History Narrative   Not on file   Social Drivers of Health   Financial Resource Strain: Not on file  Food Insecurity: Not on file  Transportation Needs: No Transportation Needs (01/13/2020)   PRAPARE - Administrator, Civil Service (Medical): No    Lack of Transportation (Non-Medical): No  Physical Activity: Not on file  Stress: Not on file  Social Connections: Unknown (05/19/2021)   Received from [facility]   Social Network    Social Network: Not on file  Intimate Partner  Violence: Unknown (04/10/2021)   Received from [facility]   HITS    Physically Hurt: Not on file    Insult or Talk Down To: Not on file    Threaten Physical Harm: Not on file    Scream or Curse: Not on file    Review of Systems: ROS negative except for what is noted on the assessment and plan.  Objective:   Vitals:   09/24/23 0935  BP: (!) 146/60  Pulse: 87  Temp: 98.6 F (37 C)  TempSrc: Oral  SpO2: 100%  Weight: 135 lb 9.6 oz (61.5 kg)  Height: 5' 2 (1.575 m)    Physical Exam: Constitutional: well-appearing woman sitting in chair, in no acute distress HENT: normocephalic atraumatic, mucous membranes moist Neck: supple Cardiovascular: regular rate and rhythm, no m/r/g Pulmonary/Chest: normal work of breathing on room air, lungs clear to auscultation bilaterally Abdominal: soft, non-tender MSK: normal bulk and tone, no LE edema Neurological: alert & oriented x 3, normal gait as she was exiting the room. Uses cane for visual impairment aide Skin: warm and dry Psych: Pleasant mood and affect      Assessment & Plan:   Diabetes mellitus type 2 with complications (HCC) Lab Results  Component Value Date   HGBA1C 11.2 (A) 09/24/2023   HGBA1C 7.4 (H) 02/14/2021  Prior A1c to this one at outside practice not on Epic, 13.9. Currently on Metformin  1000 mg daily and Glipizide  5 mg BID. Per husband, patient not always takes the medications (pockets them) and chooses when to take. She is now worried about the persistently elevated readings and is asking for better controlled.   After prolonged medication reconciliation, lifestyle modification with diet, and conversations about weight and further diabetes complications, we agreed on the following plan: - Start Glargine 10 units daily (at least until A1c is better controlled) - Start Dulaglutide  0.75 mg weekly  - Precautions on side effects reviewed with pt and husband - Increase Metformin  to 2000 mg daily - Return to clinic in  ~2 weeks to monitor progress - Husband feels comfortable with injectables; requested pharmacy consult for education on injectables  Essential hypertension Above goal. Did not have time to address this during the visit. Will need to be review at next visit. 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 Husband assists with medications and other iADLs, present during this visit. Previously seen  yearly by ophthalmologist but patient has stopped going given total loss of vision. No safety concerns at this time.  Coordination of complex care Full medication reconciliation with help of the husband. We also helped patient with MyChart set up, and consults to help with medication administration. Received information about coverage assistance.    Return in about 2 weeks (around 10/08/2023) for INSULIN , GLP1, AND HYPERTENSION AND MEDICATION ADHERENCE.  Patient discussed with Dr. CHARLENA Eastern.  Hadassah Kristy Ahr, MD [facility]

## 2023-09-26 ENCOUNTER — Encounter: Payer: Self-pay | Admitting: Student

## 2023-09-26 ENCOUNTER — Other Ambulatory Visit: Payer: Self-pay

## 2023-09-26 DIAGNOSIS — Z7189 Other specified counseling: Secondary | ICD-10-CM | POA: Insufficient documentation

## 2023-09-26 DIAGNOSIS — H547 Unspecified visual loss: Secondary | ICD-10-CM | POA: Insufficient documentation

## 2023-09-26 MED ORDER — GLIPIZIDE 5 MG PO TABS
5.0000 mg | ORAL_TABLET | Freq: Two times a day (BID) | ORAL | 11 refills | Status: DC
  Filled 2023-09-26: qty 60, 30d supply, fill #0

## 2023-09-26 MED ORDER — LISINOPRIL-HYDROCHLOROTHIAZIDE 20-12.5 MG PO TABS
1.0000 | ORAL_TABLET | Freq: Every day | ORAL | 11 refills | Status: DC
Start: 2023-09-26 — End: 2023-11-12
  Filled 2023-09-26: qty 30, 30d supply, fill #0

## 2023-09-26 MED ORDER — ROSUVASTATIN CALCIUM 10 MG PO TABS
10.0000 mg | ORAL_TABLET | Freq: Every evening | ORAL | 11 refills | Status: DC
  Filled 2023-09-26: qty 30, 30d supply, fill #0

## 2023-09-26 NOTE — Assessment & Plan Note (Signed)
 Full medication reconciliation with help of the husband. We also helped patient with MyChart set up, and consults to help with medication administration. Received information about coverage assistance.

## 2023-09-26 NOTE — Assessment & Plan Note (Signed)
 Lab Results  Component Value Date   HGBA1C 11.2 (A) 09/24/2023   HGBA1C 7.4 (H) 02/14/2021  Prior A1c to this one at outside practice not on Epic, 13.9. Currently on Metformin  1000 mg daily and Glipizide  5 mg BID. Per husband, patient not always takes the medications (pockets them) and chooses when to take. She is now worried about the persistently elevated readings and is asking for better controlled.   After prolonged medication reconciliation, lifestyle modification with diet, and conversations about weight and further diabetes complications, we agreed on the following plan: - Start Glargine 10 units daily (at least until A1c is better controlled) - Start Dulaglutide  0.75 mg weekly  - Precautions on side effects reviewed with pt and husband - Increase Metformin  to 2000 mg daily - Return to clinic in ~2 weeks to monitor progress - Husband feels comfortable with injectables; requested pharmacy consult for education on injectables

## 2023-09-26 NOTE — Assessment & Plan Note (Signed)
 Above goal. Did not have time to address this during the visit. Will need to be review at next visit. Outside labs (waiting to be scanned, already in the box), with Cr and GFR within normal limits - Refilled Lisinopril -hydrochlorothiazide  20-12.5 mg daily with refills - Refilled amlodipine  5 mg daily with refills

## 2023-09-26 NOTE — Assessment & Plan Note (Signed)
 Independent for ADLs. Husband assists with medications and other iADLs, present during this visit. Previously seen yearly by ophthalmologist but patient has stopped going given total loss of vision. No safety concerns at this time.

## 2023-10-06 NOTE — Progress Notes (Signed)
 Internal Medicine Clinic Attending  Case discussed with the resident at the time of the visit.  We reviewed the resident's history and exam and pertinent patient test results.  I agree with the assessment, diagnosis, and plan of care documented in the resident's note.

## 2023-10-08 ENCOUNTER — Other Ambulatory Visit: Payer: Self-pay

## 2023-10-08 ENCOUNTER — Ambulatory Visit: Payer: Self-pay

## 2023-10-08 VITALS — BP 128/73 | HR 98 | Temp 98.3°F | Wt 137.8 lb

## 2023-10-08 DIAGNOSIS — Z23 Encounter for immunization: Secondary | ICD-10-CM

## 2023-10-08 DIAGNOSIS — E118 Type 2 diabetes mellitus with unspecified complications: Secondary | ICD-10-CM

## 2023-10-08 DIAGNOSIS — E785 Hyperlipidemia, unspecified: Secondary | ICD-10-CM

## 2023-10-08 DIAGNOSIS — Z79899 Other long term (current) drug therapy: Secondary | ICD-10-CM

## 2023-10-08 DIAGNOSIS — Z794 Long term (current) use of insulin: Secondary | ICD-10-CM

## 2023-10-08 DIAGNOSIS — Z7984 Long term (current) use of oral hypoglycemic drugs: Secondary | ICD-10-CM

## 2023-10-08 DIAGNOSIS — I10 Essential (primary) hypertension: Secondary | ICD-10-CM

## 2023-10-08 DIAGNOSIS — N3946 Mixed incontinence: Secondary | ICD-10-CM

## 2023-10-08 DIAGNOSIS — Z7985 Long-term (current) use of injectable non-insulin antidiabetic drugs: Secondary | ICD-10-CM

## 2023-10-08 MED ORDER — EZETIMIBE 10 MG PO TABS: 10.0000 mg | ORAL_TABLET | Freq: Every day | ORAL | 11 refills | Status: DC

## 2023-10-08 NOTE — Progress Notes (Unsigned)
 Established Patient Office Visit  Subjective   Patient ID: Gail Manning, female    DOB: [birth year]  Age: 78 y.o. MRN: [id number]  No chief complaint on file.  Gail Manning is a 78 year old female with a past medical history of hypertension, type 2 diabetes, GERD, retinitis pigmentosa, hyperlipidemia, and fatty liver disease who presents today for follow-up.   Review of Systems  Constitutional:  Negative for chills, fever and weight loss.  Cardiovascular:  Negative for chest pain, palpitations, claudication and leg swelling.  Gastrointestinal:  Negative for abdominal pain, nausea and vomiting.  Neurological:  Negative for dizziness, tingling and sensory change.      Objective:     There were no vitals taken for this visit.   Const: Awake, alert in NAD HENT: Normocephalic, atraumatic, mucus membranes moist Card: RRR, No MRG, No pitting edema on LE's bilaterally  Resp: LCTAB, no increased work of breathing Abd: Soft, NTND Extremities: Warm, dry    No results found for any visits on 10/08/23.  Last hemoglobin A1c Lab Results  Component Value Date   HGBA1C 11.2 (A) 09/24/2023      The 10-year ASCVD risk score (Arnett DK, et al., 2019) is: 43.7%    Assessment & Plan:   Assessment & Plan Encounter for immunization  Orders:   Flu vaccine HIGH DOSE PF(Fluzone Trivalent)  Hyperlipidemia, unspecified hyperlipidemia type The patient does have a longstanding history of hyperlipidemia.  Outside labs obtained on 07/31/2023 were scanned into the system and showed total cholesterol 244 with LDL 141.  This is above goal.  The patient has been prescribed rosuvastatin  10 mg however she reports that she is not taking this due to muscle pain and was told in the past not to take it because of her elevated liver enzymes.  At this time we will initiate Zetia 10 mg and follow-up with lipid panel at next visit. Orders:   ezetimibe (ZETIA) 10 MG tablet; Take 1 tablet (10 mg  total) by mouth daily.  Mixed stress and urge urinary incontinence The patient reports a longstanding history of incontinence stating that often times she feels as if she cannot get to the bathroom in time as well as when she sneezes or coughs she will leak urine.  She has followed with urology in the past for this and has been prescribed oxybutynin 10 mg daily which she is compliant with.  She denies symptoms of retention or anticholinergic side effects.  At this time patient has requested a referral to gynecology to further evaluate this. Orders:   Ambulatory referral to Obstetrics / Gynecology  Diabetes mellitus type 2 with complications (HCC) Last visit it seems as if an extensive conversation was had regarding her diabetes.  It was apparent that the patient was noncompliant with her medications including pocketing them frequently as she did not like to take them.  At time of last visit on 9/17 she was initiated on glargine 10units, trulicity  0.75mg , metformin  1000mg  BID, and continued on glipizide  5mg  BID.  Since this is that she has been compliant with her medications with her husband assisting her with injections.  She reports that she has not taken the Trulicity  but 1 time due to an episode of hypoglycemia which she contributed to the Trulicity .  This episode of hypoglycemia occurred 1 afternoon after she had had only a small lunch.  She reports that she did not take her blood sugar but she felt diaphoretic, dizzy, and lightheaded.  We did have a  long discussion that I believe that this is unrelated to her Trulicity  and we went over the benefits of continuing Trulicity  therapy for many of her comorbidities.  The patient verbalized understanding and agreed to restart Trulicity  0.75 mg once weekly. The patient did request to stop metformin  as she feels like she has been on it for a long time however I did advise her that at this time in the setting of her uncontrolled diabetes it would be most  beneficial to continue with the current regimen which she agreed.  The patient reports that she takes her glipizide  twice per day however most often after meals but sometimes just in the afternoon when she remembers.  I do believe her episodes of afternoon hypoglycemia could be related to her glipizide .  As Glipizide  is not recommended in the elderly, will discontinue that at this time.  Will continue with regimen of glargine 10 units, Trulicity  0.5 mg weekly, and metformin  1000 mg twice daily. Follow up in December for A1c and BMP recheck.  I discussed with the patient that if she feels as if her sugar is dropping low or she would like to be seen sooner to call the office to schedule an appointment.    Essential hypertension The patient's blood pressure is well-controlled at 128/73.  Will continue with current regimen of  Lisinopril -hydrochlorothiazide  20-12.5 and Amlodipine  5mg  daily.       Gail Novak, DO

## 2023-10-08 NOTE — Patient Instructions (Addendum)
 Gracias, Sra. [facility], por permitirnos atenderla hoy. Hoy Gail Manning.  > Diabetes - Contine con insulina 10 unidades diarias, Trulicity  0.75 mg una vez por semana y metformina 1000 mg [employer]. - DEJE de tomar glipizida. - Si nota que tiene niveles bajos de azcar en la sangre con ms frecuencia o se siente dbil, sudorosa, mareada, aturdida o con nuseas, llame al consultorio para programar una cita. > Incontinencia - He derivado a Chief of Staff; le contactaremos pronto para programar la cita. > Colesterol - Deje de tomar rosuvastatina. - COMIENCE a tomar ezetimiba 10 mg al da.   Seguimiento: 2 meses  Recuerde: Si tiene alguna pregunta o inquietud, llame a la clnica de medicina interna al [phone].   Schuyler Novak, DO [facility]   Thank you, [name] Gail Manning, for allowing us  to provide your care today. Today we discussed . . .  > Diabetes       - Continue Insulin  10units daily, trulicity  0.75mg  once per week, and metformin  1000mg  BID       - STOP taking glipizide        - If you notice your are having more low blood sugars or are feeling weak, sweaty, dizzy, lightheaded, or nauseous call the office for an appointment > Incontinence       - I have placed a referral to a gynecologist, you should be contacted soon to set up that appointment > Cholesterol       - Stop taking the rosuvastatin        - START taking ezetimibe 10mg  daily   I have ordered the following labs for you:  Lab Orders  No laboratory test(s) ordered today      Referrals ordered today:   Referral Orders         Ambulatory referral to Obstetrics / Gynecology        Follow up: 2 months    Remember:  Should you have any questions or concerns please call the internal medicine clinic at [phone].     Schuyler Novak, DO [facility]

## 2023-10-09 NOTE — Assessment & Plan Note (Signed)
 Last visit it seems as if an extensive conversation was had regarding her diabetes.  It was apparent that the patient was noncompliant with her medications including pocketing them frequently as she did not like to take them.  At time of last visit on 9/17 she was initiated on glargine 10units, trulicity  0.75mg , metformin  1000mg  BID, and continued on glipizide  5mg  BID.  Since this is that she has been compliant with her medications with her husband assisting her with injections.  She reports that she has not taken the Trulicity  but 1 time due to an episode of hypoglycemia which she contributed to the Trulicity .  This episode of hypoglycemia occurred 1 afternoon after she had had only a small lunch.  She reports that she did not take her blood sugar but she felt diaphoretic, dizzy, and lightheaded.  We did have a long discussion that I believe that this is unrelated to her Trulicity  and we went over the benefits of continuing Trulicity  therapy for many of her comorbidities.  The patient verbalized understanding and agreed to restart Trulicity  0.75 mg once weekly. The patient did request to stop metformin  as she feels like she has been on it for a long time however I did advise her that at this time in the setting of her uncontrolled diabetes it would be most beneficial to continue with the current regimen which she agreed.  The patient reports that she takes her glipizide  twice per day however most often after meals but sometimes just in the afternoon when she remembers.  I do believe her episodes of afternoon hypoglycemia could be related to her glipizide .  As Glipizide  is not recommended in the elderly, will discontinue that at this time.  Will continue with regimen of glargine 10 units, Trulicity  0.5 mg weekly, and metformin  1000 mg twice daily. Follow up in December for A1c and BMP recheck.  I discussed with the patient that if she feels as if her sugar is dropping low or she would like to be seen sooner  to call the office to schedule an appointment.

## 2023-10-09 NOTE — Assessment & Plan Note (Signed)
 The patient's blood pressure is well-controlled at 128/73.  Will continue with current regimen of  Lisinopril -hydrochlorothiazide  20-12.5 and Amlodipine  5mg  daily.

## 2023-10-09 NOTE — Assessment & Plan Note (Signed)
 The patient does have a longstanding history of hyperlipidemia.  Outside labs obtained on 07/31/2023 were scanned into the system and showed total cholesterol 244 with LDL 141.  This is above goal.  The patient has been prescribed rosuvastatin  10 mg however she reports that she is not taking this due to muscle pain and was told in the past not to take it because of her elevated liver enzymes.  At this time we will initiate Zetia 10 mg and follow-up with lipid panel at next visit. Orders:   ezetimibe (ZETIA) 10 MG tablet; Take 1 tablet (10 mg total) by mouth daily.

## 2023-10-13 NOTE — Progress Notes (Signed)
 Internal Medicine Clinic Attending  I was physically present during the key portions of the resident provided service and participated in the medical decision making of patient's management care. I reviewed pertinent patient test results.  The assessment, diagnosis, and plan were formulated together and I agree with the documentation in the resident's note.  Dickie La, MD

## 2023-10-21 ENCOUNTER — Other Ambulatory Visit: Payer: Self-pay

## 2023-11-03 ENCOUNTER — Other Ambulatory Visit: Payer: Self-pay

## 2023-11-04 ENCOUNTER — Other Ambulatory Visit: Payer: Self-pay

## 2023-11-04 MED ORDER — METFORMIN HCL 500 MG PO TABS
500.0000 mg | ORAL_TABLET | Freq: Two times a day (BID) | ORAL | 0 refills | Status: DC
  Filled 2023-11-04 – 2023-11-10 (×2): qty 180, 90d supply, fill #0

## 2023-11-10 ENCOUNTER — Telehealth: Payer: Self-pay | Admitting: *Deleted

## 2023-11-10 ENCOUNTER — Other Ambulatory Visit: Payer: Self-pay

## 2023-11-10 NOTE — Telephone Encounter (Signed)
 AMN language used [employer] # [phone]. Pt's husband was here at the office requesting another rx for Metformin  sent to the [facility] pharmacy. He stated dosage was increased to 1000 mg BID. The pharmacy gave him 500 mg; bottle stated 500 mg BID. Need new rx.  Continue Insulin  10units daily, trulicity  0.75mg  once per week, and metformin  1000mg  BIDper OV Also he stated he was unable to get Lisinopril -hydrochlorothiazide ; I called the pharmacy who stated pt received a 90 day supply on Sept 8th; this was told to pt's husband. Thanks

## 2023-11-11 ENCOUNTER — Other Ambulatory Visit: Payer: Self-pay

## 2023-11-11 MED ORDER — METFORMIN HCL 500 MG PO TABS
1000.0000 mg | ORAL_TABLET | Freq: Two times a day (BID) | ORAL | 3 refills | Status: DC
  Filled 2023-11-11: qty 360, 90d supply, fill #0

## 2023-11-11 NOTE — Addendum Note (Signed)
 Addended by: ELICIA SHARPER on: 11/11/2023 11:56 AM   Modules accepted: Orders

## 2023-11-11 NOTE — Telephone Encounter (Signed)
 Call to patient using [facility]- [phone].  Patient would like to continue in the Clinics.  States the previous doctor was unable to take care of her condition and was in fact going to refer her to a specialist.  Has questions about her medications for Diabetes along with her husband.  Patient was offered an appointment to come in to talk with the doctor about how she should and what Diabetes medications she should be one.  Scheduled for an appointment on 11/12/2023 at 8:15 AM.

## 2023-11-12 ENCOUNTER — Ambulatory Visit (INDEPENDENT_AMBULATORY_CARE_PROVIDER_SITE_OTHER): Payer: Self-pay

## 2023-11-12 ENCOUNTER — Other Ambulatory Visit: Payer: Self-pay

## 2023-11-12 ENCOUNTER — Other Ambulatory Visit (HOSPITAL_COMMUNITY): Payer: Self-pay

## 2023-11-12 VITALS — BP 125/71 | HR 86 | Temp 98.0°F | Ht 62.0 in | Wt 134.2 lb

## 2023-11-12 DIAGNOSIS — I10 Essential (primary) hypertension: Secondary | ICD-10-CM

## 2023-11-12 DIAGNOSIS — E1165 Type 2 diabetes mellitus with hyperglycemia: Secondary | ICD-10-CM

## 2023-11-12 DIAGNOSIS — E785 Hyperlipidemia, unspecified: Secondary | ICD-10-CM

## 2023-11-12 DIAGNOSIS — E118 Type 2 diabetes mellitus with unspecified complications: Secondary | ICD-10-CM

## 2023-11-12 MED ORDER — BASAGLAR KWIKPEN 100 UNIT/ML ~~LOC~~ SOPN
10.0000 [IU] | PEN_INJECTOR | Freq: Every day | SUBCUTANEOUS | 1 refills | Status: AC
  Filled 2023-11-12: qty 45, 420d supply, fill #0
  Filled 2023-11-12: qty 9, 84d supply, fill #0

## 2023-11-12 MED ORDER — TRULICITY 0.75 MG/0.5ML ~~LOC~~ SOAJ
0.7500 mg | SUBCUTANEOUS | 4 refills | Status: AC
  Filled 2023-11-12: qty 6, 84d supply, fill #0
  Filled 2023-11-12: qty 5, 70d supply, fill #0

## 2023-11-12 MED ORDER — LISINOPRIL-HYDROCHLOROTHIAZIDE 20-12.5 MG PO TABS
1.0000 | ORAL_TABLET | Freq: Every day | ORAL | 11 refills | Status: DC
Start: 2023-11-12 — End: 2023-11-12
  Filled 2023-11-12: qty 30, 30d supply, fill #0

## 2023-11-12 MED ORDER — LISINOPRIL-HYDROCHLOROTHIAZIDE 20-12.5 MG PO TABS
1.0000 | ORAL_TABLET | Freq: Every day | ORAL | 3 refills | Status: AC
  Filled 2023-11-12 (×2): qty 90, 90d supply, fill #0

## 2023-11-12 MED ORDER — EZETIMIBE 10 MG PO TABS
10.0000 mg | ORAL_TABLET | Freq: Every day | ORAL | 3 refills | Status: AC
  Filled 2023-11-12: qty 90, 90d supply, fill #0

## 2023-11-12 MED ORDER — METFORMIN HCL 500 MG PO TABS
1000.0000 mg | ORAL_TABLET | Freq: Two times a day (BID) | ORAL | 3 refills | Status: AC
  Filled 2023-11-12 (×2): qty 360, 90d supply, fill #0

## 2023-11-12 MED ORDER — EZETIMIBE 10 MG PO TABS
10.0000 mg | ORAL_TABLET | Freq: Every day | ORAL | 11 refills | Status: DC
  Filled 2023-11-12: qty 30, 30d supply, fill #0

## 2023-11-12 MED ORDER — AMLODIPINE BESYLATE 5 MG PO TABS
5.0000 mg | ORAL_TABLET | Freq: Every day | ORAL | 0 refills | Status: AC
  Filled 2023-11-12 (×2): qty 90, 90d supply, fill #0

## 2023-11-12 MED ORDER — INSULIN PEN NEEDLE 32G X 4 MM MISC
2.0000 [IU] | Freq: Every day | 11 refills | Status: AC
  Filled 2023-11-12: qty 100, 100d supply, fill #0

## 2023-11-12 NOTE — Assessment & Plan Note (Signed)
 Patient reported feeling well and without any current complaints. Medication list was reviewed and each medication was explained. Discussed the importance of all medications, why she is taking the medications, when all medications should be taken, and how much of each medication she should be taking. Patient and husband expressed understanding. Medication lists includes:  Blood pressure medications Amlodipine  5 mg, 1 pill daily  Lisinopril -hydrochlorothiazide  20-12.5 mg, 1 pill daily  Cholesterol Medication  Ezetimibe 10 mg, 1 pill daily  Diabetes Medications:   Metformin  1000 mg, TWICE daily (1000 mg in morning and 1000 mg in afternoon) Insulin  Basaglar  10 Units injected into skin DAILY  Trulicity  0.75 mg Injected into skin WEEKLY Urinary Medications: Oxybutynin CL ER 10 mg 1 pill daily.  Supplements:  B12, Mg, K, Vit E, and Vit D  -Refills of all prescription medications (except oxybutynin) were sent to pharmacy for patient to pick up 90 days prescription prior to departure to [place]. Patient was instructed to call urologist regarding oxybutynin. Provided insulin  pens through clinic.  -Attached dietary and lifestyle modification instructions in spanish to wrap up documentation.

## 2023-11-12 NOTE — Progress Notes (Signed)
 CC: medication review and refills prior to leaving the country   HPI:  [name] is a 78 y.o. female with pertinent past medical history of blindness in both eyes, T2DM, HTN, Steatotic liver, hyperlipidemia (further medical history stated below) and presents today for medication review. Please see problem based assessment and plan for additional details.  Patient and husband reports going to [place] on November 25 and is unsure of when her return date will be. Because of this, she is asking for refill of medications now.   Last Pertinent Labs Documented:     Latest Ref Rng & Units 10/02/2021    8:29 PM 10/02/2021    8:00 PM 09/01/2021    5:45 AM  BMP  Glucose 70 - 99 mg/dL  658  766   BUN 8 - 23 mg/dL  7  11   Creatinine 9.55 - 1.00 mg/dL  9.33  9.37   Sodium 864 - 145 mmol/L 130  131  127   Potassium 3.5 - 5.1 mmol/L 3.6  3.7  3.6   Chloride 98 - 111 mmol/L  95  93   CO2 22 - 32 mmol/L  22  23   Calcium  8.9 - 10.3 mg/dL  9.9  9.4        Latest Ref Rng & Units 10/02/2021    8:29 PM 10/02/2021    8:00 PM 09/01/2021    5:45 AM  CBC  WBC 4.0 - 10.5 K/uL  5.8  7.2   Hemoglobin 12.0 - 15.0 g/dL 87.3  87.6  85.8   Hematocrit 36.0 - 46.0 % 37.0  34.9  39.5   Platelets 150 - 400 K/uL  223  217     Lab Results  Component Value Date   HGBA1C 11.2 (A) 09/24/2023   HGBA1C 7.4 (H) 02/14/2021     Past Medical History:  Diagnosis Date   Breast cancer (HCC) 02/2015   Right Breast   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, right 01/20/2015   Cataracts, bilateral    Diabetes mellitus without complication (HCC)    Hyperlipidemia    Hypertension    Personal history of radiation therapy 2017   Right Breast Cancer   Retinitis pigmentosa    Urinary and fecal incontinence    urinary only    Current Outpatient Medications on File Prior to Visit  Medication Sig Dispense Refill   aspirin  EC 81 MG tablet Take 81 mg by mouth daily.     calcium  & magnesium carbonates  (MYLANTA) 311-232 MG tablet Take 3 tablets by mouth daily.     Calcium  Carb-Cholecalciferol (CALCIUM  1000 + D PO) Take 2 tablets by mouth daily.     hydrOXYzine (ATARAX) 50 MG tablet Take 50 mg by mouth at bedtime as needed for sleep.     Hypromellose (SYSTANE OVERNIGHT THERAPY OP) Place 1 drop into both eyes 3 (three) times daily.     omega-3 acid ethyl esters (LOVAZA) 1 g capsule Take 1 g by mouth 2 (two) times daily.     ondansetron  (ZOFRAN ) 4 MG tablet Take 1 tablet (4 mg total) by mouth every 6 (six) hours. 12 tablet 0   ondansetron  (ZOFRAN -ODT) 8 MG disintegrating tablet Take 1 tablet (8 mg total) by mouth every 8 (eight) hours as needed for nausea or vomiting. 20 tablet 0   rosuvastatin  (CRESTOR ) 10 MG tablet Take 1 tablet (10 mg total) by mouth every evening. 30 tablet 11   tolterodine (DETROL LA) 4  MG 24 hr capsule Take 4 mg by mouth daily.     vitamin E 1000 UNIT capsule Take 1,000 Units by mouth daily.     VITAMIN E SKIN OIL Apply 1 application topically daily.     No current facility-administered medications on file prior to visit.    Family History  Problem Relation Age of Onset   Diabetes Mother    Hypertension Mother    Rheum arthritis Mother    Cancer Father        colon   Diabetes Father    Hypertension Father    Breast cancer Sister 73   Hypertension Sister    Diabetes Sister    Diabetes Brother     Social History   Socioeconomic History   Marital status: Married    Spouse name: Not on file   Number of children: 3   Years of education: Not on file   Highest education level: Associate degree: occupational, scientist, product/process development, or vocational program  Occupational History   Not on file  Tobacco Use   Smoking status: Never   Smokeless tobacco: Never  Vaping Use   Vaping status: Never Used  Substance and Sexual Activity   Alcohol use: No   Drug use: No   Sexual activity: Yes    Birth control/protection: Post-menopausal  Other Topics Concern   Not on file  Social  History Narrative   Not on file   Social Drivers of Health   Financial Resource Strain: Low Risk  (10/08/2023)   Overall Financial Resource Strain (CARDIA)    Difficulty of Paying Living Expenses: Not very hard  Food Insecurity: No Food Insecurity (10/08/2023)   Hunger Vital Sign    Worried About Running Out of Food in the Last Year: Never true    Ran Out of Food in the Last Year: Never true  Transportation Needs: No Transportation Needs (10/08/2023)   PRAPARE - Administrator, Civil Service (Medical): No    Lack of Transportation (Non-Medical): No  Physical Activity: Not on file  Stress: No Stress Concern Present (10/08/2023)   [employer] of Occupational Health - Occupational Stress Questionnaire    Feeling of Stress: Only a little  Social Connections: Unknown (05/19/2021)   Received from [facility]   Social Network    Social Network: Not on file  Intimate Partner Violence: Unknown (04/10/2021)   Received from [facility]   HITS    Physically Hurt: Not on file    Insult or Talk Down To: Not on file    Threaten Physical Harm: Not on file    Scream or Curse: Not on file    Review of Systems: As stated below.   Vitals:   11/12/23 0845  BP: 125/71  Pulse: 86  Temp: 98 F (36.7 C)  TempSrc: Oral  SpO2: 95%  Weight: 134 lb 3.2 oz (60.9 kg)  Height: 5' 2 (1.575 m)    Physical Exam: Physical Exam Constitutional:      General: She is not in acute distress.    Appearance: She is not ill-appearing.  Cardiovascular:     Rate and Rhythm: Normal rate and regular rhythm.     Heart sounds: Normal heart sounds. No murmur heard.    No friction rub. No gallop.  Pulmonary:     Effort: Pulmonary effort is normal.     Breath sounds: Normal breath sounds. No stridor. No wheezing, rhonchi or rales.  Musculoskeletal:     Right lower leg: No  edema.     Left lower leg: No edema.  Skin:    General: Skin is warm and dry.  Neurological:     Mental Status: She is  alert. Mental status is at baseline.      Assessment & Plan:   Patient discussed with Dr. Karna Assessment & Plan Diabetes mellitus type 2 with complications (HCC) Hyperlipidemia, unspecified hyperlipidemia type Essential hypertension Uncontrolled type 2 diabetes mellitus with hyperglycemia (HCC) Patient reported feeling well and without any current complaints. Medication list was reviewed and each medication was explained. Discussed the importance of all medications, why she is taking the medications, when all medications should be taken, and how much of each medication she should be taking. Patient and husband expressed understanding. Medication lists includes:  Blood pressure medications Amlodipine  5 mg, 1 pill daily  Lisinopril -hydrochlorothiazide  20-12.5 mg, 1 pill daily  Cholesterol Medication  Ezetimibe 10 mg, 1 pill daily  Diabetes Medications:   Metformin  1000 mg, TWICE daily (1000 mg in morning and 1000 mg in afternoon) Insulin  Basaglar  10 Units injected into skin DAILY  Trulicity  0.75 mg Injected into skin WEEKLY Urinary Medications: Oxybutynin CL ER 10 mg 1 pill daily.  Supplements:  B12, Mg, K, Vit E, and Vit D  -Refills of all prescription medications (except oxybutynin) were sent to pharmacy for patient to pick up 90 days prescription prior to departure to [place]. Patient was instructed to call urologist regarding oxybutynin. Provided insulin  pens through clinic.  -Attached dietary and lifestyle modification instructions in spanish to wrap up documentation.    No orders of the defined types were placed in this encounter.    Sallyanne Primas, D.O. [facility], PGY-1 Date 11/12/2023 Time 3:21 PM

## 2023-11-12 NOTE — Patient Instructions (Addendum)
 Hoy hablamos sobre las siguientes afecciones mdicas y el plan de tratamiento: Aqu estn sus medicamentos:  Medicamentos para la presin arterial: Amlodipino 5 mg, 1 comprimido al da Lisinopril -hidroclorotiazida 20-12.5 mg, 1 comprimido al da  Medicamento para el colesterol: Ezetimiba 10 mg, 1 comprimido al [employer] para la diabetes: Metformina 1000 mg, dos veces al da (1000 mg por la omnicom y 1000 mg por la tarde) Francyne Basaglar  10 unidades inyectadas en la piel diariamente Trulicity  0.75 mg inyectado en la piel semanalmente  Medicamentos para las vas urinarias: Oxibutinina CL ER 10 mg, 1 comprimido al da. **Consulte con su mdico sobre la posibilidad de renovar la receta, si es posible.  Esperamos verle pronto. Si tiene alguna pregunta o inquietud, llame a nuestra clnica al [phone]. El mejor horario para llamar es de lunes a viernes de 9:00 a 16:00, pero siempre hay alguien disponible. Si necesita renovar sus recetas, notifique a su farmacia con una semana de anticipacin para que nos enven la solicitud.  Gracias por confiar en m para su atencin mdica. Le deseo lo mejor!  Corinthia Helmers, DO [facility]  ________________________________________________________________  Today we discussed the following medical conditions and plan: Here your medications  Blood pressure medications Amlodipine  5 mg, 1 pill daily  Lisinopril -hydrochlorothiazide  20-12.5 mg, 1 pill daily   Cholesterol Medication  Ezetimibe 10 mg, 1 pill daily   Diabetes Medications:   Metformin  1000 mg, TWICE daily (1000 mg in morning and 1000 mg in afternoon) Insulin  Basaglar  10 Units injected into skin DAILY  Trulicity  0.75 mg Injected into skin WEEKLY  Urinary Medications: Oxybutynin CL ER 10 mg 1 pill daily. **Talk to your doctor that is prescribing this medication about refills if possible.     We look forward to seeing you next time. Please call our  clinic at [phone] if you have any questions or concerns. The best time to call is Monday-Friday from 9am-4pm, but there is someone available 24/7. If you need medication refills, please notify your pharmacy one week in advance and they will send us  a request.   Thank you for trusting me with your care. Wishing you the best!   Sallyanne Primas, DO  [facility]

## 2023-11-13 ENCOUNTER — Other Ambulatory Visit (HOSPITAL_COMMUNITY): Payer: Self-pay

## 2023-11-13 NOTE — Addendum Note (Signed)
 Addended by: KARNA FELLOWS on: 11/13/2023 01:44 PM   Modules accepted: Level of Service

## 2023-11-13 NOTE — Progress Notes (Signed)
 Internal Medicine Clinic Attending  Case discussed with the resident at the time of the visit.  We reviewed the resident's history and exam and pertinent patient test results.  I agree with the assessment, diagnosis, and plan of care documented in the resident's note.

## 2023-11-21 ENCOUNTER — Other Ambulatory Visit: Payer: Self-pay
